# Patient Record
Sex: Female | Born: 1970 | Race: White | Hispanic: No | Marital: Married | Smoking: Current every day smoker
Health system: Southern US, Community
[De-identification: ages and names within clinical notes are randomized; demographics above are authoritative.]

## PROBLEM LIST (undated history)

## (undated) DIAGNOSIS — F329 Major depressive disorder, single episode, unspecified: Secondary | ICD-10-CM

## (undated) DIAGNOSIS — F32A Depression, unspecified: Secondary | ICD-10-CM

## (undated) DIAGNOSIS — T7840XA Allergy, unspecified, initial encounter: Secondary | ICD-10-CM

## (undated) DIAGNOSIS — F419 Anxiety disorder, unspecified: Secondary | ICD-10-CM

## (undated) HISTORY — DX: Allergy, unspecified, initial encounter: T78.40XA

## (undated) HISTORY — PX: HAND SURGERY: SHX662

## (undated) HISTORY — DX: Anxiety disorder, unspecified: F41.9

## (undated) HISTORY — PX: TUBAL LIGATION: SHX77

## (undated) HISTORY — DX: Major depressive disorder, single episode, unspecified: F32.9

## (undated) HISTORY — DX: Depression, unspecified: F32.A

## (undated) HISTORY — PX: WISDOM TOOTH EXTRACTION: SHX21

---

## 1998-07-22 ENCOUNTER — Other Ambulatory Visit: Admission: RE | Admit: 1998-07-22 | Discharge: 1998-07-22 | Payer: Self-pay | Admitting: *Deleted

## 2002-11-25 ENCOUNTER — Emergency Department (HOSPITAL_COMMUNITY): Admission: EM | Admit: 2002-11-25 | Discharge: 2002-11-26 | Payer: Self-pay | Admitting: *Deleted

## 2002-11-26 ENCOUNTER — Ambulatory Visit (HOSPITAL_BASED_OUTPATIENT_CLINIC_OR_DEPARTMENT_OTHER): Admission: RE | Admit: 2002-11-26 | Discharge: 2002-11-26 | Payer: Self-pay | Admitting: Orthopedic Surgery

## 2002-11-26 ENCOUNTER — Encounter: Payer: Self-pay | Admitting: *Deleted

## 2009-04-10 ENCOUNTER — Encounter: Admission: RE | Admit: 2009-04-10 | Discharge: 2009-04-10 | Payer: Self-pay | Admitting: Family Medicine

## 2011-02-05 NOTE — Op Note (Signed)
[name], [name]                       ACCOUNT NO.:  [id number]   MEDICAL RECORD NO.:  [id number]                   PATIENT TYPE:  AMB   LOCATION:  [facility]SC                                  FACILITY:  [facility]   PHYSICIAN:  Katy Fitch. Naaman Plummer., M.D.          DATE OF BIRTH:  [birth year]   DATE OF PROCEDURE:  11/26/2002  DATE OF DISCHARGE:                                 OPERATIVE REPORT   PREOPERATIVE DIAGNOSIS:  Status post bilateral dog bite injuries with  development of severe cellulitis and early abscess formation, left hand and  ring finger.   POSTOPERATIVE DIAGNOSIS:  Status post bilateral dog bite injuries with  development of severe cellulitis and early abscess formation, left hand and  ring finger.   OPERATION:  Incision and drainage of multiple small dog bite abscesses, left  hand including dorsal wound and palmar aspect of left ring finger proximal  phalangeal segment with exploration and irrigation of flexor sheath.   SURGEON:  Katy Fitch. Sypher, M.D.   ASSISTANT:  None.   ANESTHESIA:  Infraclavicular regional block by Dr. Janetta Hora. [place].   INDICATIONS:  The patient is a 40 year old woman with a history of breaking  up a dog fight on November 25, 2002 sustaining deep wounds to be both hands.  She was seen by Dr. Sheppard Penton. Mayer in the emergency room last evening and  had her wounds irrigated, debrided, and tacked with Iodoform gauze.  She was  advised to seek follow-up in 24 hours at our office.  Upon examination, she  is noted to be developing an abscess on the palmar aspect of her left ring  finger.  We made arrangements for immediate incision and drainage at this  time.   DESCRIPTION OF PROCEDURE:  The patient is brought to the operating room and  placed in the supine position on the operating table.  Following placement  of axillary block, the left arm was prepped with Betadine soap and solution  and sterilely draped.   Following exsanguination of the limb  with Esmarch bandage, an arterial  tourniquet was inflated to 240 mmHg.  The procedure commenced with  meticulous examination of the hand.  There appeared to be swelling of flexor  sheath of the ring finger.  The sheath was exposed at the distal palmar  crease through an oblique incision exposing the A1 pulley.  The sheath was  entered and found to have some fluid collecting.   A second abscess was opened with a Brunner's zigzag incision distally  exposing the subcutaneous space.  Purulent material is recovered from the  subcutaneous space and cultured for aerobic and anaerobic growth.   The patient has been on oral Keflex for at least 16 hours therefore the  culture may be impaired by the oral antibiotics.   The wound was irrigated thoroughly with sterile saline, followed by  threading of the #5 pediatric feeding tube into the flexor sheath.  The  sheath was irrigated until the fluid was clear.  The sheath was sewn in  place with a 5-0 nylon suture at the proximal distal palmar crease wound.   The dorsal hand wound was then widely opened, irrigated with sterile saline,  and dressed with a vesi-loop drain.  The wounds were then dressed with  nonadherent dressings, followed by a voluminous Kerlix gauze dressing with  ACE wrap.  The patient is advised to return to our office for follow up  including a whirlpool therapy in 24 hours.   DISCHARGE MEDICATIONS:  Tylox 5 mg 1-2 tablets p.o. q.4-6h. p.r.n. pain (a  total of 30 tablets).   DISCHARGE INSTRUCTIONS:  She will return to see [place] in the office in 24 hours  or sooner p.r.n. problems.                                                 Katy Fitch Naaman Plummer., M.D.    RVS/MEDQ  D:  11/26/2002  T:  11/26/2002  Job:  [id number]

## 2011-04-02 ENCOUNTER — Emergency Department (HOSPITAL_COMMUNITY): Payer: BC Managed Care – PPO

## 2011-04-02 ENCOUNTER — Encounter (HOSPITAL_COMMUNITY): Payer: Self-pay

## 2011-04-02 ENCOUNTER — Emergency Department (HOSPITAL_COMMUNITY)
Admission: EM | Admit: 2011-04-02 | Discharge: 2011-04-02 | Disposition: A | Discharge status: Home / Self Care | Payer: BC Managed Care – PPO | Attending: Emergency Medicine | Admitting: Emergency Medicine

## 2011-04-02 DIAGNOSIS — F411 Generalized anxiety disorder: Secondary | ICD-10-CM | POA: Insufficient documentation

## 2011-04-02 DIAGNOSIS — R0602 Shortness of breath: Secondary | ICD-10-CM | POA: Insufficient documentation

## 2011-04-02 DIAGNOSIS — R0789 Other chest pain: Secondary | ICD-10-CM | POA: Insufficient documentation

## 2011-04-02 DIAGNOSIS — R0601 Orthopnea: Secondary | ICD-10-CM | POA: Insufficient documentation

## 2013-01-05 ENCOUNTER — Other Ambulatory Visit: Payer: Self-pay

## 2013-01-05 ENCOUNTER — Ambulatory Visit (INDEPENDENT_AMBULATORY_CARE_PROVIDER_SITE_OTHER): Payer: BC Managed Care – PPO | Admitting: Family Medicine

## 2013-01-05 VITALS — BP 142/88 | HR 86 | Temp 100.0°F | Resp 20 | Ht 68.0 in | Wt 304.0 lb

## 2013-01-05 DIAGNOSIS — F418 Other specified anxiety disorders: Secondary | ICD-10-CM

## 2013-01-05 DIAGNOSIS — F341 Dysthymic disorder: Secondary | ICD-10-CM

## 2013-01-05 LAB — TSH: TSH: 2.249 u[IU]/mL (ref 0.350–4.500)

## 2013-01-05 MED ORDER — CITALOPRAM HYDROBROMIDE 20 MG PO TABS: 20.0000 mg | ORAL_TABLET | Freq: Every day | ORAL | Status: DC

## 2013-01-05 NOTE — Progress Notes (Signed)
  Subjective:    Patient ID: [name], female    DOB: 05/06/1971, 42 y.o.   MRN: [id number]  HPI [name] is a 42 y.o. female Here for follow up of anxiety.  Prior patient of [facility] - started on Celexa 20mg  QD about 2 years ago. Last dose today. Has skipped few days at times if running low so will not run out. Has missed few doses at times. Treated for anxiety, but also has had depression. Initially had anxiety attack.  Had been on Cymbalta in past, with 2 prior attempts at coming off medicine. - sx's recur few years later.  Has not seen counselor prior. No suicide sx's.  Stress with work prior. Feels down all the time, trouble with motivation, tearful episodes at times. occassional anhedonia.   No fever, has had some allergy symptoms.   Not working - out of work few years. smokes 1 ppd, no etoh, no illicit drug use.     Review of Systems  Constitutional: Negative for fever.  HENT: Positive for rhinorrhea.   Respiratory: Negative for cough.   Gastrointestinal: Negative for abdominal pain.  Psychiatric/Behavioral: Positive for sleep disturbance and dysphoric mood. Negative for hallucinations. The patient is nervous/anxious.        Objective:   Physical Exam  Vitals reviewed. Constitutional: She is oriented to person, place, and time. She appears well-developed and well-nourished. No distress.  overweight  HENT:  Head: Normocephalic and atraumatic.  Neck: Normal range of motion. Neck supple. Thyromegaly present.  Cardiovascular: Normal rate, normal heart sounds and intact distal pulses.   Pulmonary/Chest: Effort normal and breath sounds normal.  Abdominal: Soft. There is no tenderness.  Neurological: She is alert and oriented to person, place, and time.  Skin: Skin is warm and dry.  Psychiatric: She has a normal mood and affect. Her behavior is normal.        Assessment & Plan:  [name] is a 42 y.o. female Depression with anxiety -  Plan: TSH, citalopram (CELEXA) 20 MG tablet recurrent depression with anxiety.  New pt. Continue celexa, refer for counselling, discussed exercise, check tsh and recheck ov next 1 month. rtc precautions.  Meds ordered this encounter  Medications  . DISCONTD: citalopram (CELEXA) 20 MG tablet    Sig: Take 20 mg by mouth daily.  Marland Kitchen loratadine (CLARITIN) 10 MG tablet    Sig: Take 10 mg by mouth daily.  . citalopram (CELEXA) 20 MG tablet    Sig: Take 1 tablet (20 mg total) by mouth daily.    Dispense:  90 tablet    Refill:  0   Patient Instructions  Continue the celexa - 1 per day.  Call one of the counselors below to arrange an appointment. walking or other activity most days of the week. claritin for allergies. Your should receive a call or letter about your lab results within the next week to 10 days. Recheck in next 4-6 weeks.  Return to the clinic or go to the nearest emergency room if any of your symptoms worsen or new symptoms occur, including any fever.   [facility] [place]: [phone] [place] or [place]: [phone] [place]: [phone]

## 2013-01-05 NOTE — Patient Instructions (Addendum)
Continue the celexa - 1 per day.  Call one of the counselors below to arrange an appointment. walking or other activity most days of the week. claritin for allergies. Your should receive a call or letter about your lab results within the next week to 10 days. Recheck in next 4-6 weeks.  Return to the clinic or go to the nearest emergency room if any of your symptoms worsen or new symptoms occur, including any fever.   [facility] [place]: [phone] [place] or [place]: [phone] [place]: [phone]

## 2013-08-17 ENCOUNTER — Other Ambulatory Visit: Payer: Self-pay | Admitting: Family Medicine

## 2013-08-20 ENCOUNTER — Other Ambulatory Visit: Payer: Self-pay | Admitting: Family Medicine

## 2013-08-20 NOTE — Telephone Encounter (Signed)
Called in Celexa for 30 day, can not authorize 90 day without visit.

## 2013-08-21 NOTE — Telephone Encounter (Signed)
Patient needs a follow up for further refills

## 2013-08-21 NOTE — Telephone Encounter (Signed)
Left message for pt to rtc for follow up. Per last OV in April pt was to follow up 4-6 weeks following visit. Ok to fill 30 day supply with note?

## 2013-08-21 NOTE — Telephone Encounter (Signed)
Noted.  Yes - 30 day supply ok  - needs follow up before this running out to lessen chance of w/d sx's.

## 2013-08-23 NOTE — Telephone Encounter (Addendum)
This is coming to me as addendum. 30 day supply was already sent on 11/28

## 2014-06-04 ENCOUNTER — Ambulatory Visit (INDEPENDENT_AMBULATORY_CARE_PROVIDER_SITE_OTHER): Payer: Self-pay | Admitting: Family Medicine

## 2014-06-04 VITALS — BP 126/86 | HR 85 | Temp 98.3°F | Resp 16 | Ht 68.0 in | Wt 296.0 lb

## 2014-06-04 DIAGNOSIS — J0100 Acute maxillary sinusitis, unspecified: Secondary | ICD-10-CM

## 2014-06-04 DIAGNOSIS — F410 Panic disorder [episodic paroxysmal anxiety] without agoraphobia: Secondary | ICD-10-CM

## 2014-06-04 DIAGNOSIS — J0101 Acute recurrent maxillary sinusitis: Secondary | ICD-10-CM

## 2014-06-04 MED ORDER — LORATADINE 10 MG PO TABS: 10.0000 mg | ORAL_TABLET | Freq: Every day | ORAL | Status: DC

## 2014-06-04 MED ORDER — PREDNISONE 20 MG PO TABS: ORAL_TABLET | ORAL | Status: DC

## 2014-06-04 MED ORDER — AMOXICILLIN 875 MG PO TABS: 875.0000 mg | ORAL_TABLET | Freq: Three times a day (TID) | ORAL | Status: DC

## 2014-06-04 MED ORDER — CITALOPRAM HYDROBROMIDE 20 MG PO TABS: 20.0000 mg | ORAL_TABLET | Freq: Every day | ORAL | Status: DC

## 2014-06-04 NOTE — Progress Notes (Signed)
Subjective:    Patient ID: [name], female    DOB: [birth year], 43 y.o.   MRN: [id number] Chief Complaint  Patient presents with  . rx refills    celexa  . Nasal Congestion    x1 week   . Fever    night and morning     HPI  Has been off of the celexa for several months as she has been out of insurance.  When she was on one  celexa a day and doing great but then when she lost her insurance she tried to stock pile them for when she needs it.  Developed nasal congestion, sneezing several days ago with diarrhea.  Has had fevers and chills started yesterday.    Past Medical History  Diagnosis Date  . Allergy   . Depression   . Anxiety    No current outpatient prescriptions on file prior to visit.   No current facility-administered medications on file prior to visit.   No Known Allergies   Review of Systems  Constitutional: Positive for fever, chills, diaphoresis, activity change, appetite change and fatigue. Negative for unexpected weight change.  HENT: Positive for congestion, ear pain, postnasal drip, rhinorrhea, sinus pressure, sneezing and sore throat. Negative for ear discharge, nosebleeds, trouble swallowing and voice change.   Eyes: Negative for discharge and itching.  Respiratory: Positive for cough. Negative for shortness of breath.   Cardiovascular: Negative for chest pain.  Gastrointestinal: Positive for diarrhea. Negative for nausea, vomiting and abdominal pain.  Musculoskeletal: Negative for neck pain and neck stiffness.  Skin: Negative for rash.  Neurological: Positive for headaches. Negative for dizziness and syncope.  Hematological: Positive for adenopathy.  Psychiatric/Behavioral: Positive for behavioral problems, sleep disturbance and dysphoric mood. Negative for suicidal ideas and self-injury. The patient is nervous/anxious.        Objective:  BP 126/86  Pulse 85  Temp(Src) 98.3 F (36.8 C) (Oral)  Resp 16  Ht  (1.727 m)  Wt 296 lb  (134.265 kg)  BMI 45.02 kg/m2  SpO2 96%  LMP 05/16/2014  Physical Exam  Constitutional: She is oriented to person, place, and time. She appears well-developed and well-nourished. She appears lethargic. She appears ill. No distress.  HENT:  Head: Normocephalic and atraumatic.  Right Ear: External ear and ear canal normal. Tympanic membrane is retracted. A middle ear effusion is present.  Left Ear: External ear and ear canal normal. Tympanic membrane is retracted. A middle ear effusion is present.  Nose: Mucosal edema and rhinorrhea present. Right sinus exhibits maxillary sinus tenderness. Left sinus exhibits maxillary sinus tenderness.  Mouth/Throat: Uvula is midline and mucous membranes are normal. Posterior oropharyngeal erythema present. No oropharyngeal exudate, posterior oropharyngeal edema or tonsillar abscesses.  Eyes: Conjunctivae are normal. Right eye exhibits no discharge. Left eye exhibits no discharge. No scleral icterus.  Neck: Normal range of motion. Neck supple.  Cardiovascular: Normal rate, regular rhythm, normal heart sounds and intact distal pulses.   Pulmonary/Chest: Effort normal and breath sounds normal.  Lymphadenopathy:       Head (right side): Submandibular adenopathy present. No preauricular and no posterior auricular adenopathy present.       Head (left side): Submandibular adenopathy present. No preauricular and no posterior auricular adenopathy present.    She has no cervical adenopathy.       Right: No supraclavicular adenopathy present.       Left: No supraclavicular adenopathy present.  Neurological: She is oriented to person, place, and time.  She appears lethargic.  Skin: Skin is warm and dry. She is not diaphoretic. No erythema.  Psychiatric: She has a normal mood and affect. Her behavior is normal.          Assessment & Plan:   Panic attacks - was previously well controlled on celexa 20 but off for sev mos due to cost of RTC - restart, taper up with  1/2 tab po qd x 1-2 wks and increase to 1 tab po qd after 1-2 wks as long as side effect free.  Acute recurrent maxillary sinusitis  Meds ordered this encounter  Medications  . citalopram (CELEXA) 20 MG tablet    Sig: Take 1 tablet (20 mg total) by mouth daily.    Dispense:  90 tablet    Refill:  3  . loratadine (CLARITIN) 10 MG tablet    Sig: Take 1 tablet (10 mg total) by mouth daily.    Dispense:  90 tablet    Refill:  3  . amoxicillin (AMOXIL) 875 MG tablet    Sig: Take 1 tablet (875 mg total) by mouth 3 (three) times daily.    Dispense:  30 tablet    Refill:  0  . predniSONE (DELTASONE) 20 MG tablet    Sig: Take 3 tabs po qd x 3d, take 2 tabs po qd x 3d, take 1 tab po qd x 3d    Dispense:  18 tablet    Refill:  0   Norberto Sorenson, MD MPH

## 2014-06-04 NOTE — Patient Instructions (Addendum)
Start taking 1/2 tab daily of the citalopram then you can go up to 1 whole tab a day after a week as long as you are not having any side effects.  Hot showers or breathing in steam may help loosen the congestion.  Using a netti pot or sinus rinse is also likely to help you feel better and keep this from progressing. I recommend augmenting with 12 hr sudafed (behind the counter) and generic mucinex to help you move out the congestion.  If no improvement or you are getting worse, come back but hopefully with all of the above, you can avoid it.  Sinusitis Sinusitis is redness, soreness, and inflammation of the paranasal sinuses. Paranasal sinuses are air pockets within the bones of your face (beneath the eyes, the middle of the forehead, or above the eyes). In healthy paranasal sinuses, mucus is able to drain out, and air is able to circulate through them by way of your nose. However, when your paranasal sinuses are inflamed, mucus and air can become trapped. This can allow bacteria and other germs to grow and cause infection. Sinusitis can develop quickly and last only a short time (acute) or continue over a long period (chronic). Sinusitis that lasts for more than 12 weeks is considered chronic.  CAUSES  Causes of sinusitis include:  Allergies.  Structural abnormalities, such as displacement of the cartilage that separates your nostrils (deviated septum), which can decrease the air flow through your nose and sinuses and affect sinus drainage.  Functional abnormalities, such as when the small hairs (cilia) that line your sinuses and help remove mucus do not work properly or are not present. SIGNS AND SYMPTOMS  Symptoms of acute and chronic sinusitis are the same. The primary symptoms are pain and pressure around the affected sinuses. Other symptoms include:  Upper toothache.  Earache.  Headache.  Bad breath.  Decreased sense of smell and taste.  A cough, which worsens when you are lying  flat.  Fatigue.  Fever.  Thick drainage from your nose, which often is green and may contain pus (purulent).  Swelling and warmth over the affected sinuses. DIAGNOSIS  Your health care provider will perform a physical exam. During the exam, your health care provider may:  Look in your nose for signs of abnormal growths in your nostrils (nasal polyps).  Tap over the affected sinus to check for signs of infection.  View the inside of your sinuses (endoscopy) using an imaging device that has a light attached (endoscope). If your health care provider suspects that you have chronic sinusitis, one or more of the following tests may be recommended:  Allergy tests.  Nasal culture. A sample of mucus is taken from your nose, sent to a lab, and screened for bacteria.  Nasal cytology. A sample of mucus is taken from your nose and examined by your health care provider to determine if your sinusitis is related to an allergy. TREATMENT  Most cases of acute sinusitis are related to a viral infection and will resolve on their own within 10 days. Sometimes medicines are prescribed to help relieve symptoms (pain medicine, decongestants, nasal steroid sprays, or saline sprays).  However, for sinusitis related to a bacterial infection, your health care provider will prescribe antibiotic medicines. These are medicines that will help kill the bacteria causing the infection.  Rarely, sinusitis is caused by a fungal infection. In theses cases, your health care provider will prescribe antifungal medicine. For some cases of chronic sinusitis, surgery is needed.  Generally, these are cases in which sinusitis recurs more than 3 times per year, despite other treatments. HOME CARE INSTRUCTIONS   Drink plenty of water. Water helps thin the mucus so your sinuses can drain more easily.  Use a humidifier.  Inhale steam 3 to 4 times a day (for example, sit in the bathroom with the shower running).  Apply a warm,  moist washcloth to your face 3 to 4 times a day, or as directed by your health care provider.  Use saline nasal sprays to help moisten and clean your sinuses.  Take medicines only as directed by your health care provider.  If you were prescribed either an antibiotic or antifungal medicine, finish it all even if you start to feel better. SEEK IMMEDIATE MEDICAL CARE IF:  You have increasing pain or severe headaches.  You have nausea, vomiting, or drowsiness.  You have swelling around your face.  You have vision problems.  You have a stiff neck.  You have difficulty breathing. MAKE SURE YOU:   Understand these instructions.  Will watch your condition.  Will get help right away if you are not doing well or get worse. Document Released: 09/06/2005 Document Revised: 01/21/2014 Document Reviewed: 09/21/2011 [facility] Patient Information 2015 [place], [place]. This information is not intended to replace advice given to you by your health care provider. Make sure you discuss any questions you have with your health care provider.

## 2014-11-30 ENCOUNTER — Encounter (HOSPITAL_COMMUNITY): Payer: Self-pay | Admitting: Emergency Medicine

## 2014-11-30 ENCOUNTER — Emergency Department (HOSPITAL_COMMUNITY)
Admission: EM | Admit: 2014-11-30 | Discharge: 2014-12-01 | Disposition: A | Discharge status: Home / Self Care | Payer: Self-pay | Attending: Emergency Medicine | Admitting: Emergency Medicine

## 2014-11-30 DIAGNOSIS — F419 Anxiety disorder, unspecified: Secondary | ICD-10-CM | POA: Insufficient documentation

## 2014-11-30 DIAGNOSIS — R109 Unspecified abdominal pain: Secondary | ICD-10-CM

## 2014-11-30 DIAGNOSIS — F329 Major depressive disorder, single episode, unspecified: Secondary | ICD-10-CM | POA: Insufficient documentation

## 2014-11-30 DIAGNOSIS — Z9851 Tubal ligation status: Secondary | ICD-10-CM | POA: Insufficient documentation

## 2014-11-30 DIAGNOSIS — Z3202 Encounter for pregnancy test, result negative: Secondary | ICD-10-CM | POA: Insufficient documentation

## 2014-11-30 DIAGNOSIS — N8320 Unspecified ovarian cysts: Secondary | ICD-10-CM | POA: Insufficient documentation

## 2014-11-30 DIAGNOSIS — Z720 Tobacco use: Secondary | ICD-10-CM | POA: Insufficient documentation

## 2014-11-30 DIAGNOSIS — N83201 Unspecified ovarian cyst, right side: Secondary | ICD-10-CM

## 2014-11-30 DIAGNOSIS — Z79899 Other long term (current) drug therapy: Secondary | ICD-10-CM | POA: Insufficient documentation

## 2014-11-30 DIAGNOSIS — R110 Nausea: Secondary | ICD-10-CM | POA: Insufficient documentation

## 2014-11-30 LAB — POC URINE PREG, ED: Preg Test, Ur: NEGATIVE
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
## 2014-11-30 NOTE — ED Notes (Signed)
Pt reports right groin (radiating to right side) pain starting a month ago. Has intermittent nausea. Denies F/V/D/chills. No hx kidney stones but does have hx ovarian cysts. No pain medication taken today. Denies dysuria and any other urinary symptoms. No other c/c.

## 2014-12-01 ENCOUNTER — Emergency Department (HOSPITAL_COMMUNITY): Payer: Self-pay

## 2014-12-01 LAB — CBC WITH DIFFERENTIAL/PLATELET
Basophils Absolute: 0 10*3/uL (ref 0.0–0.1)
Basophils Relative: 0 % (ref 0–1)
EOS ABS: 0.2 10*3/uL (ref 0.0–0.7)
Eosinophils Relative: 2 % (ref 0–5)
HCT: 41.2 % (ref 36.0–46.0)
HEMOGLOBIN: 13.4 g/dL (ref 12.0–15.0)
LYMPHS ABS: 5.7 10*3/uL — AB (ref 0.7–4.0)
LYMPHS PCT: 41 % (ref 12–46)
MCH: 27.6 pg (ref 26.0–34.0)
MCHC: 32.5 g/dL (ref 30.0–36.0)
MCV: 84.8 fL (ref 78.0–100.0)
MONO ABS: 0.7 10*3/uL (ref 0.1–1.0)
MONOS PCT: 5 % (ref 3–12)
NEUTROS ABS: 7.2 10*3/uL (ref 1.7–7.7)
Neutrophils Relative %: 52 % (ref 43–77)
PLATELETS: 203 10*3/uL (ref 150–400)
RBC: 4.86 MIL/uL (ref 3.87–5.11)
RDW: 13.4 % (ref 11.5–15.5)
WBC: 13.7 10*3/uL — AB (ref 4.0–10.5)

## 2014-12-01 LAB — COMPREHENSIVE METABOLIC PANEL
ALK PHOS: 74 U/L (ref 39–117)
ALT: 12 U/L (ref 0–35)
ANION GAP: 8 (ref 5–15)
AST: 17 U/L (ref 0–37)
Albumin: 3.5 g/dL (ref 3.5–5.2)
BUN: 12 mg/dL (ref 6–23)
CO2: 24 mmol/L (ref 19–32)
Calcium: 9 mg/dL (ref 8.4–10.5)
Chloride: 107 mmol/L (ref 96–112)
Creatinine, Ser: 0.86 mg/dL (ref 0.50–1.10)
GFR calc Af Amer: >90 mL/min (ref >90)
GFR calc non Af Amer: 83 mL/min — ABNORMAL LOW (ref >90)
GLUCOSE: 97 mg/dL (ref 70–99)
POTASSIUM: 3.9 mmol/L (ref 3.5–5.1)
Sodium: 139 mmol/L (ref 135–145)
TOTAL PROTEIN: 6.9 g/dL (ref 6.0–8.3)
Total Bilirubin: 0.5 mg/dL (ref 0.3–1.2)

## 2014-12-01 LAB — LIPASE, BLOOD: Lipase: 17 U/L (ref 11–59)

## 2014-12-01 MED ORDER — ONDANSETRON 4 MG PO TBDP
4.0000 mg | ORAL_TABLET | Freq: Once | ORAL | Status: AC
  Administered 2014-12-01: 4 mg via ORAL
  Filled 2014-12-01: qty 1

## 2014-12-01 MED ORDER — MORPHINE SULFATE 4 MG/ML IJ SOLN
4.0000 mg | Freq: Once | INTRAMUSCULAR | Status: AC
  Administered 2014-12-01: 4 mg via INTRAVENOUS
  Filled 2014-12-01: qty 1

## 2014-12-01 MED ORDER — DICYCLOMINE HCL 10 MG PO CAPS
10.0000 mg | ORAL_CAPSULE | Freq: Once | ORAL | Status: AC
  Administered 2014-12-01: 10 mg via ORAL
  Filled 2014-12-01: qty 1

## 2014-12-01 MED ORDER — IOHEXOL 300 MG/ML  SOLN
50.0000 mL | Freq: Once | INTRAMUSCULAR | Status: AC | PRN
  Administered 2014-12-01: 50 mL via ORAL

## 2014-12-01 MED ORDER — IOHEXOL 300 MG/ML  SOLN
100.0000 mL | Freq: Once | INTRAMUSCULAR | Status: AC | PRN
  Administered 2014-12-01: 100 mL via INTRAVENOUS

## 2014-12-01 MED ORDER — NAPROXEN 500 MG PO TABS: 500.0000 mg | ORAL_TABLET | Freq: Two times a day (BID) | ORAL | Status: DC

## 2014-12-01 NOTE — Discharge Instructions (Signed)
Please follow the directions provided.  [facility].  Take the naproxen twice a day.  Don't hesitate to return for any new, worsening or concerning symptoms.     SEEK IMMEDIATE MEDICAL CARE IF:  You have increasing abdominal pain.  You feel sick to your stomach (nauseous), and you throw up (vomit).  You develop a fever that comes on suddenly.  You have abdominal pain during a bowel movement.  Your menstrual periods become heavier than usual.

## 2014-12-01 NOTE — ED Provider Notes (Signed)
1:30 AM: At end of shift, received hand-off report from Will Dansie, PA-C. Plan includes awaiting Ct abd/pelvis result to eval rt lower quadrant pain. Pt currently resting without distress.   3:00 AM: Pt continues to rest without distress, awaiting CT results.  05:00 AM: CT results reviewed: shows right ovarian cyst.  Discussed findings with pt.  Pt is well-appearing, in no acute distress and vital signs reviewed and not concerning. Although not charted in nursing note, the vital signs I observed in the room prior to discharge were bp: 132/76, hr 78. She appears safe to be discharged.  Discharge include resources [facility]. Advised to NSAIDs for pain.  Return precautions provided. Pt aware of plan and in agreement.   Filed Vitals:   11/30/14 2231 12/01/14 0144  BP: 147/92 111/57  Pulse: 85 71  Temp: 98.3 F (36.8 C) 98.6 F (37 C)  TempSrc: Oral Oral  Resp: 16 16  SpO2: 98% 97%     Meds given in ED:  Medications  ondansetron (ZOFRAN-ODT) disintegrating tablet 4 mg (4 mg Oral Given 12/01/14 0055)  dicyclomine (BENTYL) capsule 10 mg (10 mg Oral Given 12/01/14 0055)  iohexol (OMNIPAQUE) 300 MG/ML solution 50 mL (50 mLs Oral Contrast Given 12/01/14 0316)  iohexol (OMNIPAQUE) 300 MG/ML solution 100 mL (100 mLs Intravenous Contrast Given 12/01/14 0407)  morphine 4 MG/ML injection 4 mg (4 mg Intravenous Given 12/01/14 0550)    Discharge Medication List as of 12/01/2014  5:42 AM    START taking these medications   Details  naproxen (NAPROSYN) 500 MG tablet Take 1 tablet (500 mg total) by mouth 2 (two) times daily., Starting 12/01/2014, Until Discontinued, Print                  Harle BattiestElizabeth Virgin Zellers, NP 12/01/14 1617  Devoria AlbeIva Knapp, MD 12/02/14 [phone]

## 2014-12-12 ENCOUNTER — Inpatient Hospital Stay (HOSPITAL_COMMUNITY)
Admission: AD | Admit: 2014-12-12 | Discharge: 2014-12-13 | Disposition: A | Discharge status: Home / Self Care | Payer: Self-pay | Source: Ambulatory Visit | Attending: Obstetrics and Gynecology | Admitting: Obstetrics and Gynecology

## 2014-12-12 DIAGNOSIS — Z8742 Personal history of other diseases of the female genital tract: Secondary | ICD-10-CM

## 2014-12-12 DIAGNOSIS — Z79899 Other long term (current) drug therapy: Secondary | ICD-10-CM | POA: Insufficient documentation

## 2014-12-12 DIAGNOSIS — R1032 Left lower quadrant pain: Secondary | ICD-10-CM | POA: Insufficient documentation

## 2014-12-12 DIAGNOSIS — R1031 Right lower quadrant pain: Secondary | ICD-10-CM | POA: Insufficient documentation

## 2014-12-12 DIAGNOSIS — F419 Anxiety disorder, unspecified: Secondary | ICD-10-CM | POA: Insufficient documentation

## 2014-12-12 DIAGNOSIS — F17210 Nicotine dependence, cigarettes, uncomplicated: Secondary | ICD-10-CM | POA: Insufficient documentation

## 2014-12-12 DIAGNOSIS — R109 Unspecified abdominal pain: Secondary | ICD-10-CM

## 2014-12-12 DIAGNOSIS — Z833 Family history of diabetes mellitus: Secondary | ICD-10-CM | POA: Insufficient documentation

## 2014-12-12 DIAGNOSIS — N200 Calculus of kidney: Secondary | ICD-10-CM | POA: Insufficient documentation

## 2014-12-12 DIAGNOSIS — F329 Major depressive disorder, single episode, unspecified: Secondary | ICD-10-CM | POA: Insufficient documentation

## 2014-12-13 ENCOUNTER — Encounter (HOSPITAL_COMMUNITY): Payer: Self-pay

## 2014-12-13 ENCOUNTER — Inpatient Hospital Stay (HOSPITAL_COMMUNITY): Payer: Self-pay

## 2014-12-13 DIAGNOSIS — Z8742 Personal history of other diseases of the female genital tract: Secondary | ICD-10-CM

## 2014-12-13 LAB — URINALYSIS, DIPSTICK ONLY
Bilirubin Urine: NEGATIVE
GLUCOSE, UA: NEGATIVE mg/dL
Ketones, ur: NEGATIVE mg/dL
Nitrite: NEGATIVE
PH: 6 (ref 5.0–8.0)
Protein, ur: NEGATIVE mg/dL
SPECIFIC GRAVITY, URINE: 1.025 (ref 1.005–1.030)
Urobilinogen, UA: 0.2 mg/dL (ref 0.0–1.0)

## 2014-12-13 LAB — POCT PREGNANCY, URINE: Preg Test, Ur: NEGATIVE

## 2014-12-13 MED ORDER — OXYCODONE-ACETAMINOPHEN 5-325 MG PO TABS
1.0000 | ORAL_TABLET | ORAL | Status: DC | PRN
Start: 2014-12-13 — End: 2017-02-11

## 2014-12-13 NOTE — MAU Note (Signed)
RLQ pain x 6-7 weeks. Was told at ED 2 weeks ago that she has ovarian cyst. States now the pain is moving up her sides and feels like "back labor". Denies dysuria. States that this doesn't feel like ovarian cysts she's had in the past. Has had low grade fever off and on, last one was 100 a few days ago. Saw some blood last time she went to the bathroom, thinks it's coming from her vagina; period is due any day now. Denies vaginal discharge.

## 2014-12-13 NOTE — MAU Provider Note (Signed)
History     CSN: [id number]  Arrival date and time: 12/12/14 2314   First Provider Initiated Contact with Patient 12/13/14 0029      Chief Complaint  Patient presents with  . Flank Pain  . Abdominal Pain   HPI Comments: [name] is a 44 y.o. [id number] who presents today with RLQ and right flank pain. She states that it feels like "back  Labor", and rates her pain 8/10 at this time. She states that she has had the pain for about a month, and was seen at the [facility] ED, and was told she had an ovarian cyst. She had a CT scan done at that time. Record review shows that the CT scan also showed non-obstructing stones in both kidneys. She states she took ibuprofen and naprosyn yesterday, and it did not help with the pain.   Flank Pain The current episode started more than 1 month ago. The problem occurs constantly. The problem has been gradually worsening since onset. The pain is at a severity of 8/10. Associated symptoms include abdominal pain. Pertinent negatives include no dysuria or fever. She has tried NSAIDs for the symptoms. The treatment provided no relief.  Abdominal Pain The current episode started more than 1 month ago. The pain is located in the left flank, right flank, LLQ and RLQ. The pain is at a severity of 8/10. The quality of the pain is colicky. Associated symptoms include constipation (had to push for a BM today ) and nausea. Pertinent negatives include no diarrhea, dysuria, fever, frequency or vomiting.      Past Medical History  Diagnosis Date  . Allergy   . Depression   . Anxiety     Past Surgical History  Procedure Laterality Date  . Tubal ligation    . Wisdom tooth extraction      Family History  Problem Relation Age of Onset  . Diabetes Mother   . Asthma Mother   . Obesity Mother   . Diabetes Paternal Grandmother     History  Substance Use Topics  . Smoking status: Current Every Day Smoker -- 0.25 packs/day    Types: Cigarettes  . Smokeless  tobacco: Not on file  . Alcohol Use: No    Allergies: No Known Allergies  Prescriptions prior to admission  Medication Sig Dispense Refill Last Dose  . citalopram (CELEXA) 20 MG tablet Take 1 tablet (20 mg total) by mouth daily. 90 tablet 3 12/12/2014 at Unknown time  . ibuprofen (ADVIL,MOTRIN) 200 MG tablet Take 800 mg by mouth every 6 (six) hours as needed for moderate pain.   12/12/2014 at Unknown time    Review of Systems  Constitutional: Negative for fever.  Gastrointestinal: Positive for nausea, abdominal pain and constipation (had to push for a BM today ). Negative for vomiting and diarrhea.  Genitourinary: Positive for flank pain. Negative for dysuria, urgency and frequency.   Physical Exam   Blood pressure 144/81, pulse 70, temperature 99 F (37.2 C), temperature source Oral, resp. rate 18, height  (1.727 m), weight 127.007 kg (280 lb), last menstrual period 11/16/2014.  Physical Exam  Nursing note and vitals reviewed. Constitutional: She is oriented to person, place, and time. She appears well-developed and well-nourished. No distress.  Cardiovascular: Normal rate.   Respiratory: Effort normal.  GI: Soft. There is no tenderness.  Neurological: She is alert and oriented to person, place, and time.  Skin: Skin is warm and dry.  Psychiatric: She has a normal mood  and affect.   Results for orders placed or performed during the hospital encounter of 12/12/14 (from the past 24 hour(s))  Urinalysis, dipstick only     Status: Abnormal   Collection Time: 12/12/14 11:55 PM  Result Value Ref Range   Specific Gravity, Urine 1.025 1.005 - 1.030   pH 6.0 5.0 - 8.0   Glucose, UA NEGATIVE NEGATIVE mg/dL   Hgb urine dipstick TRACE (A) NEGATIVE   Bilirubin Urine NEGATIVE NEGATIVE   Ketones, ur NEGATIVE NEGATIVE mg/dL   Protein, ur NEGATIVE NEGATIVE mg/dL   Urobilinogen, UA 0.2 0.0 - 1.0 mg/dL   Nitrite NEGATIVE NEGATIVE   Leukocytes, UA TRACE (A) NEGATIVE  Pregnancy, urine  POC     Status: None   Collection Time: 12/13/14 12:04 AM  Result Value Ref Range   Preg Test, Ur NEGATIVE NEGATIVE   US Transvaginal Non-ob  12/13/2014   CLINICAL DATA:  Bilateral lower quadrant pain for 7 weeks. Initial encounter.  EXAM: TRANSABDOMINAL AND TRANSVAGINAL ULTRASOUND OF PELVIS  TECHNIQUE: Both transabdominal and transvaginal ultrasound examinations of the pelvis were performed. Transabdominal technique was performed for global imaging of the pelvis including uterus, ovaries, adnexal regions, and pelvic cul-de-sac. It was necessary to proceed with endovaginal exam following the transabdominal exam to visualize the uterus and ovaries in greater detail.  COMPARISON:  CT of the abdomen and pelvis from 12/01/2014  FINDINGS: Uterus  Measurements: 7.8 x 5.2 x 5.2 cm. No fibroids or other mass visualized. Nabothian cysts are seen at the cervix.  Endometrium  Thickness: 1.4 cm.  No focal abnormality visualized.  Right ovary  Measurements: 3.5 x 3.3 x 2.5 cm. Normal appearance/no adnexal mass.  Left ovary  Measurements: 2.6 x 2.6 x 2.5 cm. Normal appearance/no adnexal mass.  Other findings  Trace free fluid is seen within the pelvic cul-de-sac.  IMPRESSION: Unremarkable pelvic ultrasound.   Electronically Signed   By: Roanna Raider M.D.   On: 12/13/2014 01:17   US Pelvis Complete  12/13/2014   CLINICAL DATA:  Bilateral lower quadrant pain for 7 weeks. Initial encounter.  EXAM: TRANSABDOMINAL AND TRANSVAGINAL ULTRASOUND OF PELVIS  TECHNIQUE: Both transabdominal and transvaginal ultrasound examinations of the pelvis were performed. Transabdominal technique was performed for global imaging of the pelvis including uterus, ovaries, adnexal regions, and pelvic cul-de-sac. It was necessary to proceed with endovaginal exam following the transabdominal exam to visualize the uterus and ovaries in greater detail.  COMPARISON:  CT of the abdomen and pelvis from 12/01/2014  FINDINGS: Uterus  Measurements: 7.8 x  5.2 x 5.2 cm. No fibroids or other mass visualized. Nabothian cysts are seen at the cervix.  Endometrium  Thickness: 1.4 cm.  No focal abnormality visualized.  Right ovary  Measurements: 3.5 x 3.3 x 2.5 cm. Normal appearance/no adnexal mass.  Left ovary  Measurements: 2.6 x 2.6 x 2.5 cm. Normal appearance/no adnexal mass.  Other findings  Trace free fluid is seen within the pelvic cul-de-sac.  IMPRESSION: Unremarkable pelvic ultrasound.   Electronically Signed   By: Roanna Raider M.D.   On: 12/13/2014 01:17    MAU Course  Procedures  MDM   Assessment and Plan   1. History of ovarian cyst   2. Right sided abdominal pain   3. Nephrolithiasis    DC home RX percocet FU with urology as needed   Follow-up Information    [facility]   Specialty:  Emergency Medicine   Why:  If symptoms worsen  Contact information:   [address] [id number] [place] [id number] [phone]       Tawnya CrookHogan, Heather Donovan 12/13/2014, 12:31 AM

## 2014-12-13 NOTE — Discharge Instructions (Signed)

## 2016-12-27 ENCOUNTER — Emergency Department (HOSPITAL_COMMUNITY)
Admission: EM | Admit: 2016-12-27 | Discharge: 2016-12-27 | Disposition: A | Discharge status: Home / Self Care | Payer: Self-pay | Attending: Emergency Medicine | Admitting: Emergency Medicine

## 2016-12-27 ENCOUNTER — Encounter (HOSPITAL_COMMUNITY): Payer: Self-pay | Admitting: Emergency Medicine

## 2016-12-27 ENCOUNTER — Emergency Department (HOSPITAL_BASED_OUTPATIENT_CLINIC_OR_DEPARTMENT_OTHER)
Admit: 2016-12-27 | Discharge: 2016-12-27 | Disposition: A | Discharge status: Home / Self Care | Payer: Self-pay | Attending: Emergency Medicine | Admitting: Emergency Medicine

## 2016-12-27 ENCOUNTER — Emergency Department (HOSPITAL_COMMUNITY): Payer: Self-pay

## 2016-12-27 DIAGNOSIS — Z79899 Other long term (current) drug therapy: Secondary | ICD-10-CM | POA: Insufficient documentation

## 2016-12-27 DIAGNOSIS — R072 Precordial pain: Secondary | ICD-10-CM | POA: Insufficient documentation

## 2016-12-27 DIAGNOSIS — Z91040 Latex allergy status: Secondary | ICD-10-CM | POA: Insufficient documentation

## 2016-12-27 DIAGNOSIS — R079 Chest pain, unspecified: Secondary | ICD-10-CM

## 2016-12-27 DIAGNOSIS — M7989 Other specified soft tissue disorders: Secondary | ICD-10-CM

## 2016-12-27 DIAGNOSIS — M79609 Pain in unspecified limb: Secondary | ICD-10-CM

## 2016-12-27 DIAGNOSIS — F17210 Nicotine dependence, cigarettes, uncomplicated: Secondary | ICD-10-CM | POA: Insufficient documentation

## 2016-12-27 DIAGNOSIS — R600 Localized edema: Secondary | ICD-10-CM | POA: Insufficient documentation

## 2016-12-27 LAB — COMPREHENSIVE METABOLIC PANEL
ALT: 16 U/L (ref 14–54)
AST: 16 U/L (ref 15–41)
Albumin: 3.4 g/dL — ABNORMAL LOW (ref 3.5–5.0)
Alkaline Phosphatase: 83 U/L (ref 38–126)
Anion gap: 6 (ref 5–15)
BILIRUBIN TOTAL: 0.6 mg/dL (ref 0.3–1.2)
BUN: 11 mg/dL (ref 6–20)
CO2: 25 mmol/L (ref 22–32)
Calcium: 9 mg/dL (ref 8.9–10.3)
Chloride: 107 mmol/L (ref 101–111)
Creatinine, Ser: 0.78 mg/dL (ref 0.44–1.00)
GFR calc Af Amer: >60 mL/min (ref >60)
Glucose, Bld: 90 mg/dL (ref 65–99)
Potassium: 3.9 mmol/L (ref 3.5–5.1)
Sodium: 138 mmol/L (ref 135–145)
Total Protein: 6.8 g/dL (ref 6.5–8.1)

## 2016-12-27 LAB — CBC
HEMATOCRIT: 40.6 % (ref 36.0–46.0)
HEMOGLOBIN: 13.6 g/dL (ref 12.0–15.0)
MCH: 27.6 pg (ref 26.0–34.0)
MCHC: 33.5 g/dL (ref 30.0–36.0)
MCV: 82.5 fL (ref 78.0–100.0)
Platelets: 224 10*3/uL (ref 150–400)
RBC: 4.92 MIL/uL (ref 3.87–5.11)
RDW: 14.2 % (ref 11.5–15.5)
WBC: 13.6 10*3/uL — ABNORMAL HIGH (ref 4.0–10.5)

## 2016-12-27 LAB — I-STAT TROPONIN, ED
Troponin i, poc: 0 ng/mL (ref 0.00–0.08)
Troponin i, poc: 0 ng/mL (ref 0.00–0.08)

## 2016-12-27 LAB — LIPASE, BLOOD: LIPASE: 19 U/L (ref 11–51)

## 2016-12-27 MED ORDER — ASPIRIN 81 MG PO CHEW
324.0000 mg | CHEWABLE_TABLET | Freq: Once | ORAL | Status: AC
Start: 2016-12-27 — End: 2016-12-27
  Administered 2016-12-27: 324 mg via ORAL
  Filled 2016-12-27: qty 4

## 2016-12-27 NOTE — Progress Notes (Signed)
**  Preliminary report by tech**  Left lower extremity venous duplex complete. There is no obvious evidence of deep or superficial vein thrombosis involving the left lower extremity. All clearly visualized vessels appear patent and compressible. There is no evidence of a Baker's cyst on the left. Results were given to Dr. Eudelia Bunch.  12/27/16 3:38 PM Olen Cordial RVT

## 2016-12-27 NOTE — ED Provider Notes (Addendum)
[facility]-EMERGENCY DEPT Provider Note   CSN: [id number] Arrival date & time: 12/27/16  1108     History   Chief Complaint Chief Complaint  Patient presents with  . Leg Swelling    HPI [name] is a 46 y.o. female.  The history is provided by the patient.  Chest Pain   This is a recurrent problem. Episode onset: several months. Episode frequency: intermittent. The problem has been resolved. The pain is associated with an emotional upset. The pain is present in the substernal region. The pain is at a severity of 2/10. The quality of the pain is described as heavy. The pain radiates to the left arm. Duration of episode(s) is 15 minutes. Associated symptoms include lower extremity edema (BLE. noted for several months. associated left leg pain that has worsened over the last few weeks.) and shortness of breath. Pertinent negatives include no cough, no fever, no malaise/fatigue and no nausea. Risk factors include smoking/tobacco exposure and obesity.  Pertinent negatives for past medical history include no CAD, no cancer, no diabetes, no DVT, no hyperlipidemia, no hypertension, no MI and no PE.  Pertinent negatives for family medical history include: no early MI.  Procedure history is negative for cardiac catheterization and exercise treadmill test.    Past Medical History:  Diagnosis Date  . Allergy   . Anxiety   . Depression     There are no active problems to display for this patient.   Past Surgical History:  Procedure Laterality Date  . TUBAL LIGATION    . WISDOM TOOTH EXTRACTION      OB History    Gravida Para Term Preterm AB Living   SAB TAB Ectopic Multiple Live Births                   Home Medications    Prior to Admission medications   Medication Sig Start Date End Date Taking? Authorizing Provider  citalopram (CELEXA) 20 MG tablet Take 1 tablet (20 mg total) by mouth daily. Patient not taking: Reported on 12/27/2016 06/04/14   Sherren Mocha,  MD  oxyCODONE-acetaminophen (PERCOCET/ROXICET) 5-325 MG per tablet Take 1-2 tablets by mouth every 4 (four) hours as needed for severe pain. Patient not taking: Reported on 12/27/2016 12/13/14   Armando Reichert, CNM    Family History Family History  Problem Relation Age of Onset  . Diabetes Mother   . Asthma Mother   . Obesity Mother   . Diabetes Paternal Grandmother     Social History Social History  Substance Use Topics  . Smoking status: Current Every Day Smoker    Packs/day: 0.25    Types: Cigarettes  . Smokeless tobacco: Not on file  . Alcohol use No     Allergies   Latex   Review of Systems Review of Systems  Constitutional: Negative for fever and malaise/fatigue.  Respiratory: Positive for shortness of breath. Negative for cough.   Cardiovascular: Positive for chest pain.  Gastrointestinal: Negative for nausea.  All other systems are reviewed and are negative for acute change except as noted in the HPI    Physical Exam Updated Vital Signs BP (!) 114/91 (BP Location: Right Arm)   Pulse 62   Temp 97.9 F (36.6 C) (Oral)   Resp 12   Ht  (1.727 m)   Wt 300 lb (136.1 kg)   LMP 12/19/2016   SpO2 91%   BMI 45.61  kg/m   Physical Exam  Constitutional: She is oriented to person, place, and time. She appears well-developed and well-nourished. No distress.  HENT:  Head: Normocephalic and atraumatic.  Nose: Nose normal.  Eyes: Conjunctivae and EOM are normal. Pupils are equal, round, and reactive to light. Right eye exhibits no discharge. Left eye exhibits no discharge. No scleral icterus.  Neck: Normal range of motion. Neck supple.  Cardiovascular: Normal rate and regular rhythm.  Exam reveals no gallop and no friction rub.   No murmur heard. Pulmonary/Chest: Effort normal and breath sounds normal. No stridor. No respiratory distress. She has no rales.  Abdominal: Soft. She exhibits no distension. There is no tenderness.  Musculoskeletal: She exhibits no  edema.       Left upper leg: She exhibits tenderness.       Left lower leg: She exhibits tenderness. She exhibits no bony tenderness.  BLE nonpitting edema.   Neurological: She is alert and oriented to person, place, and time.  Skin: Skin is warm and dry. No rash noted. She is not diaphoretic. No erythema.  Psychiatric: She has a normal mood and affect.  Vitals reviewed.    ED Treatments / Results  Labs (all labs ordered are listed, but only abnormal results are displayed) Labs Reviewed - No data to display  EKG  EKG Interpretation None       Radiology No results found.  Procedures Procedures (including critical care time)  Medications Ordered in ED Medications - No data to display   Initial Impression / Assessment and Plan / ED Course  I have reviewed the triage vital signs and the nursing notes.  Pertinent labs & imaging results that were available during my care of the patient were reviewed by me and considered in my medical decision making (see chart for details).     1. Chest pain Atypical chest pain most consistent with anxiety however given patient's obesity and history of smoking, will obtain cardiac workup to rule out ACS.  EKG without acute ischemic changes or evidence of pericarditis. Initial troponin negative. HEART <3. Appropriate for delta troponin.  It delta troponin negative, patient should follow-up with her primary care provider for stress testing within 30 days.  Chest x-ray without evidence suggestive of pneumonia, pneumothorax, pneumomediastinum.  No abnormal contour of the mediastinum to suggest dissection. No evidence of acute injuries.  Presentation a classic for aortic dissection or esophageal perforation. Low pretest probability for pulmonary embolism.  2. Bilateral lower extremity edema Patient is concern for blood clots. Ultrasound negative for DVT. Likely secondary to venous insufficiency. Recommended compression garment and elevation.  PCP follow up.    Final Clinical Impressions(s) / ED Diagnoses   Final diagnoses:  Chest pain  Leg swelling      Disposition: Discharge  Condition: Good  I have discussed the results, Dx and Tx plan with the patient who expressed understanding and agree(s) with the plan. Discharge instructions discussed at great length. The patient was given strict return precautions who verbalized understanding of the instructions. No further questions at time of discharge.    New Prescriptions   No medications on file    Follow Up: Primary care provider  Call  To schedule stress testing within 30 days      Nira Conn, MD 12/27/16 1731

## 2016-12-27 NOTE — ED Triage Notes (Signed)
Pt c/o bilateral leg swelling for past few months, with worsening right foot swelling and left calf swelling over the past 4-5 days. Pt is now having left calf tenderness. Pt concerned due to mother passing of PE.

## 2017-02-11 ENCOUNTER — Encounter (HOSPITAL_COMMUNITY): Payer: Self-pay | Admitting: Emergency Medicine

## 2017-02-11 ENCOUNTER — Emergency Department (HOSPITAL_COMMUNITY)
Admission: EM | Admit: 2017-02-11 | Discharge: 2017-02-11 | Disposition: A | Discharge status: Home / Self Care | Payer: Self-pay | Attending: Emergency Medicine | Admitting: Emergency Medicine

## 2017-02-11 ENCOUNTER — Emergency Department (HOSPITAL_COMMUNITY): Payer: Self-pay

## 2017-02-11 DIAGNOSIS — F17210 Nicotine dependence, cigarettes, uncomplicated: Secondary | ICD-10-CM | POA: Insufficient documentation

## 2017-02-11 DIAGNOSIS — N200 Calculus of kidney: Secondary | ICD-10-CM | POA: Insufficient documentation

## 2017-02-11 LAB — URINALYSIS, ROUTINE W REFLEX MICROSCOPIC
BILIRUBIN URINE: NEGATIVE
GLUCOSE, UA: NEGATIVE mg/dL
KETONES UR: 5 mg/dL — AB
NITRITE: NEGATIVE
PH: 5 (ref 5.0–8.0)
Protein, ur: 30 mg/dL — AB
SPECIFIC GRAVITY, URINE: 1.03 (ref 1.005–1.030)

## 2017-02-11 LAB — COMPREHENSIVE METABOLIC PANEL
ALBUMIN: 3.7 g/dL (ref 3.5–5.0)
ALK PHOS: 82 U/L (ref 38–126)
ALT: 16 U/L (ref 14–54)
ANION GAP: 11 (ref 5–15)
AST: 18 U/L (ref 15–41)
BILIRUBIN TOTAL: 0.4 mg/dL (ref 0.3–1.2)
BUN: 16 mg/dL (ref 6–20)
CALCIUM: 9.2 mg/dL (ref 8.9–10.3)
CO2: 22 mmol/L (ref 22–32)
Chloride: 105 mmol/L (ref 101–111)
Creatinine, Ser: 1.07 mg/dL — ABNORMAL HIGH (ref 0.44–1.00)
GFR calc Af Amer: >60 mL/min (ref >60)
GFR calc non Af Amer: >60 mL/min (ref >60)
GLUCOSE: 133 mg/dL — AB (ref 65–99)
Potassium: 3.9 mmol/L (ref 3.5–5.1)
SODIUM: 138 mmol/L (ref 135–145)
TOTAL PROTEIN: 7.2 g/dL (ref 6.5–8.1)

## 2017-02-11 LAB — PREGNANCY, URINE: Preg Test, Ur: NEGATIVE

## 2017-02-11 LAB — LIPASE, BLOOD: Lipase: 23 U/L (ref 11–51)

## 2017-02-11 LAB — CBC
HCT: 40 % (ref 36.0–46.0)
HEMOGLOBIN: 13.3 g/dL (ref 12.0–15.0)
MCH: 27.2 pg (ref 26.0–34.0)
MCHC: 33.3 g/dL (ref 30.0–36.0)
MCV: 81.8 fL (ref 78.0–100.0)
Platelets: 248 10*3/uL (ref 150–400)
RBC: 4.89 MIL/uL (ref 3.87–5.11)
RDW: 14.1 % (ref 11.5–15.5)
WBC: 12.3 10*3/uL — ABNORMAL HIGH (ref 4.0–10.5)

## 2017-02-11 MED ORDER — KETOROLAC TROMETHAMINE 30 MG/ML IJ SOLN
30.0000 mg | Freq: Once | INTRAMUSCULAR | Status: AC
  Administered 2017-02-11: 30 mg via INTRAVENOUS
  Filled 2017-02-11: qty 1

## 2017-02-11 MED ORDER — HYDROMORPHONE HCL 1 MG/ML IJ SOLN
1.0000 mg | Freq: Once | INTRAMUSCULAR | Status: AC
  Administered 2017-02-11: 1 mg via INTRAVENOUS
  Filled 2017-02-11: qty 1

## 2017-02-11 MED ORDER — ONDANSETRON HCL 4 MG/2ML IJ SOLN
4.0000 mg | Freq: Once | INTRAMUSCULAR | Status: AC
  Administered 2017-02-11: 4 mg via INTRAVENOUS
  Filled 2017-02-11: qty 2

## 2017-02-11 MED ORDER — TAMSULOSIN HCL 0.4 MG PO CAPS: 0.4000 mg | ORAL_CAPSULE | Freq: Every day | ORAL | 0 refills | Status: AC

## 2017-02-11 MED ORDER — OXYCODONE-ACETAMINOPHEN 5-325 MG PO TABS: 1.0000 | ORAL_TABLET | ORAL | 0 refills | Status: AC | PRN

## 2017-02-11 NOTE — ED Triage Notes (Signed)
Active vomiting at time of triage.

## 2017-02-11 NOTE — ED Triage Notes (Signed)
Pt reports having right sided flank pain that started at groin and has moved to back and has not had any relief with Motrin. Pt reports prior kidney stone hx. Unable to evaluate urine due to menses.

## 2017-02-11 NOTE — ED Provider Notes (Signed)
[facility]-EMERGENCY DEPT Provider Note   CSN: [id number] Arrival date & time: 02/11/17  0107  By signing my name below, I, Bing NeighborsMaurice Deon Copeland Jr., attest that this documentation has been prepared under the direction and in the presence of No att. providers found. Electronically signed: Bing NeighborsMaurice Deon Copeland Jr., ED Scribe. 02/11/17. 6:12 AM.   History   Chief Complaint Chief Complaint  Patient presents with  . Flank Pain  . Emesis    HPI  [name] is a 46 y.o. female with hx of kidney stone, ovarian cyst who presents to the Emergency Department complaining of R flank pain with onset x1 day. Pt states that she developed R flank pain that radiates to the groin in the past x1 day. She reports hx of kidney stones but states that this is much worse. Pt reports nausea, vomiting. She has taken Motrin with no relief. She denies any modifying factors. Of note, pt is on her monthly menstrual.   The history is provided by the patient. No language interpreter was used.    Past Medical History:  Diagnosis Date  . Allergy   . Anxiety   . Depression     There are no active problems to display for this patient.   Past Surgical History:  Procedure Laterality Date  . TUBAL LIGATION    . WISDOM TOOTH EXTRACTION      OB History    Gravida Para Term Preterm AB Living   2 2 2     2    SAB TAB Ectopic Multiple Live Births                   Home Medications    Prior to Admission medications   Medication Sig Start Date End Date Taking? Authorizing Provider  oxyCODONE-acetaminophen (PERCOCET) 5-325 MG tablet Take 1-2 tablets by mouth every 4 (four) hours as needed. 02/11/17   Gilda CreasePollina, Socorro Ebron J, MD  tamsulosin (FLOMAX) 0.4 MG CAPS capsule Take 1 capsule (0.4 mg total) by mouth daily. 02/11/17   Gilda CreasePollina, Chanoch Mccleery J, MD    Family History Family History  Problem Relation Age of Onset  . Diabetes Mother   . Asthma Mother   . Obesity Mother   . Diabetes Paternal  Grandmother     Social History Social History  Substance Use Topics  . Smoking status: Current Every Day Smoker    Packs/day: 0.25    Types: Cigarettes  . Smokeless tobacco: Not on file  . Alcohol use No     Allergies   Latex   Review of Systems Review of Systems  Constitutional: Negative for fever.  Gastrointestinal: Positive for nausea and vomiting.  Genitourinary: Positive for flank pain (R).     Physical Exam Updated Vital Signs BP 136/70 (BP Location: Left Arm)   Pulse 74   Temp 98.7 F (37.1 C) (Oral)   Resp 14   Ht 5\' 8"  (1.727 m)   Wt 136.1 kg (300 lb)   LMP 02/10/2017 (Exact Date)   SpO2 99%   BMI 45.61 kg/m   Physical Exam  Constitutional: She is oriented to person, place, and time. She appears well-developed and well-nourished. No distress.  HENT:  Head: Normocephalic and atraumatic.  Right Ear: Hearing normal.  Left Ear: Hearing normal.  Nose: Nose normal.  Mouth/Throat: Oropharynx is clear and moist and mucous membranes are normal.  Eyes: Conjunctivae and EOM are normal. Pupils are equal, round, and reactive to light.  Neck: Normal range of motion. Neck  supple.  Cardiovascular: Regular rhythm, S1 normal and S2 normal.  Exam reveals no gallop and no friction rub.   No murmur heard. Pulmonary/Chest: Effort normal and breath sounds normal. No respiratory distress. She exhibits no tenderness.  Abdominal: Soft. Normal appearance and bowel sounds are normal. There is no hepatosplenomegaly. There is no tenderness. There is CVA tenderness. There is no rebound, no guarding, no tenderness at McBurney's point and negative Murphy's sign. No hernia.  Slight R CVA tenderness.   Musculoskeletal: Normal range of motion.  Neurological: She is alert and oriented to person, place, and time. She has normal strength. No cranial nerve deficit or sensory deficit. Coordination normal. GCS eye subscore is 4. GCS verbal subscore is 5. GCS motor subscore is 6.  Skin: Skin  is warm, dry and intact. No rash noted. No cyanosis.  Psychiatric: She has a normal mood and affect. Her speech is normal and behavior is normal. Thought content normal.  Nursing note and vitals reviewed.    ED Treatments / Results   DIAGNOSTIC STUDIES: Oxygen Saturation is 95% on RA, inadequate by my interpretation.   COORDINATION OF CARE: 6:12 AM-Discussed next steps with pt. Pt verbalized understanding and is agreeable with the plan.    Labs (all labs ordered are listed, but only abnormal results are displayed) Labs Reviewed  COMPREHENSIVE METABOLIC PANEL - Abnormal; Notable for the following:       Result Value   Glucose, Bld 133 (*)    Creatinine, Ser 1.07 (*)    All other components within normal limits  CBC - Abnormal; Notable for the following:    WBC 12.3 (*)    All other components within normal limits  URINALYSIS, ROUTINE W REFLEX MICROSCOPIC - Abnormal; Notable for the following:    Color, Urine AMBER (*)    APPearance CLOUDY (*)    Hgb urine dipstick LARGE (*)    Ketones, ur 5 (*)    Protein, ur 30 (*)    Leukocytes, UA SMALL (*)    Bacteria, UA RARE (*)    Squamous Epithelial / LPF 6-30 (*)    All other components within normal limits  LIPASE, BLOOD  PREGNANCY, URINE    EKG  EKG Interpretation None       Radiology Ct Renal Stone Study  Result Date: 02/11/2017 CLINICAL DATA:  Right flank pain radiating to the groin. Nausea and vomiting. White cell count 12.3. EXAM: CT ABDOMEN AND PELVIS WITHOUT CONTRAST TECHNIQUE: Multidetector CT imaging of the abdomen and pelvis was performed following the standard protocol without IV contrast. COMPARISON:  12/01/2014 FINDINGS: Lower chest: The lung bases are clear. Hepatobiliary: No focal liver abnormality is seen. No gallstones, gallbladder wall thickening, or biliary dilatation. Pancreas: Unremarkable. No pancreatic ductal dilatation or surrounding inflammatory changes. Spleen: Normal in size without focal  abnormality. Adrenals/Urinary Tract: No adrenal gland nodules. 3 mm stone in the distal right ureter with mild right hydronephrosis and hydroureter. Mild stranding around the right kidney and ureter. Additional tiny stones demonstrated in the right kidney and left kidney. No left hydronephrosis. Bladder wall is not thickened. Stomach/Bowel: Stomach, small bowel, and colon are decompressed with scattered stool seen in the colon. No inflammatory changes are appreciated. Appendix is not identified. Vascular/Lymphatic: No significant vascular findings are present. No enlarged abdominal or pelvic lymph nodes. Reproductive: Uterus and bilateral adnexa are unremarkable. Other: No abdominal wall hernia or abnormality. No abdominopelvic ascites. Musculoskeletal: No acute or significant osseous findings. IMPRESSION: 3 mm stone in the distal  right ureter with moderate proximal obstruction. Additional bilateral nonobstructing intrarenal stones bilaterally. Electronically Signed   By: Burman Nieves M.D.   On: 02/11/2017 02:55    Procedures Procedures (including critical care time)  Medications Ordered in ED Medications  ondansetron (ZOFRAN) injection 4 mg (4 mg Intravenous Given 02/11/17 0143)  HYDROmorphone (DILAUDID) injection 1 mg (1 mg Intravenous Given 02/11/17 0143)  ketorolac (TORADOL) 30 MG/ML injection 30 mg (30 mg Intravenous Given 02/11/17 0352)     Initial Impression / Assessment and Plan / ED Course  I have reviewed the triage vital signs and the nursing notes.  Pertinent labs & imaging results that were available during my care of the patient were reviewed by me and considered in my medical decision making (see chart for details).     Patient presents with right flank pain. She is complaining of pain in the lower back that radiates into the right groin. She has had associated nausea and vomiting. Workup reveals 3 mm distal ureteral stone with mild hydronephrosis. Patient has achieved good pain  control with Dilaudid and Toradol. She was prescribed Flomax, Percocet and is to follow-up with urology as an outpatient.  Final Clinical Impressions(s) / ED Diagnoses   Final diagnoses:  Kidney stone    New Prescriptions Discharge Medication List as of 02/11/2017  5:05 AM    START taking these medications   Details  oxyCODONE-acetaminophen (PERCOCET) 5-325 MG tablet Take 1-2 tablets by mouth every 4 (four) hours as needed., Starting Fri 02/11/2017, Print    tamsulosin (FLOMAX) 0.4 MG CAPS capsule Take 1 capsule (0.4 mg total) by mouth daily., Starting Fri 02/11/2017, Print       I personally performed the services described in this documentation, which was scribed in my presence. The recorded information has been reviewed and is accurate.      Gilda Crease, MD 02/11/17 [phone]

## 2017-11-23 ENCOUNTER — Encounter (HOSPITAL_COMMUNITY): Payer: Self-pay | Admitting: *Deleted

## 2017-11-23 ENCOUNTER — Emergency Department (HOSPITAL_BASED_OUTPATIENT_CLINIC_OR_DEPARTMENT_OTHER)
Admit: 2017-11-23 | Discharge: 2017-11-23 | Disposition: A | Discharge status: Home / Self Care | Payer: Self-pay | Attending: Emergency Medicine | Admitting: Emergency Medicine

## 2017-11-23 ENCOUNTER — Emergency Department (HOSPITAL_COMMUNITY)
Admission: EM | Admit: 2017-11-23 | Discharge: 2017-11-23 | Disposition: A | Discharge status: Home / Self Care | Payer: Self-pay | Attending: Emergency Medicine | Admitting: Emergency Medicine

## 2017-11-23 ENCOUNTER — Other Ambulatory Visit: Payer: Self-pay

## 2017-11-23 ENCOUNTER — Emergency Department (HOSPITAL_COMMUNITY): Payer: Self-pay

## 2017-11-23 DIAGNOSIS — M79602 Pain in left arm: Secondary | ICD-10-CM | POA: Insufficient documentation

## 2017-11-23 DIAGNOSIS — Z79899 Other long term (current) drug therapy: Secondary | ICD-10-CM | POA: Insufficient documentation

## 2017-11-23 DIAGNOSIS — Z91040 Latex allergy status: Secondary | ICD-10-CM | POA: Insufficient documentation

## 2017-11-23 DIAGNOSIS — M79609 Pain in unspecified limb: Secondary | ICD-10-CM

## 2017-11-23 DIAGNOSIS — R2232 Localized swelling, mass and lump, left upper limb: Secondary | ICD-10-CM | POA: Insufficient documentation

## 2017-11-23 DIAGNOSIS — M7989 Other specified soft tissue disorders: Secondary | ICD-10-CM

## 2017-11-23 DIAGNOSIS — F17210 Nicotine dependence, cigarettes, uncomplicated: Secondary | ICD-10-CM | POA: Insufficient documentation

## 2017-11-23 LAB — BASIC METABOLIC PANEL
ANION GAP: 8 (ref 5–15)
BUN: 12 mg/dL (ref 6–20)
CO2: 23 mmol/L (ref 22–32)
Calcium: 8.7 mg/dL — ABNORMAL LOW (ref 8.9–10.3)
Chloride: 106 mmol/L (ref 101–111)
Creatinine, Ser: 0.93 mg/dL (ref 0.44–1.00)
GFR calc non Af Amer: >60 mL/min (ref >60)
Glucose, Bld: 98 mg/dL (ref 65–99)
Potassium: 4.1 mmol/L (ref 3.5–5.1)
SODIUM: 137 mmol/L (ref 135–145)

## 2017-11-23 LAB — I-STAT BETA HCG BLOOD, ED (MC, WL, AP ONLY)

## 2017-11-23 LAB — CBC
HEMATOCRIT: 40.4 % (ref 36.0–46.0)
HEMOGLOBIN: 13 g/dL (ref 12.0–15.0)
MCH: 27.7 pg (ref 26.0–34.0)
MCHC: 32.2 g/dL (ref 30.0–36.0)
MCV: 86.1 fL (ref 78.0–100.0)
Platelets: ADEQUATE 10*3/uL (ref 150–400)
RBC: 4.69 MIL/uL (ref 3.87–5.11)
RDW: 14.4 % (ref 11.5–15.5)
WBC: 13.6 10*3/uL — AB (ref 4.0–10.5)

## 2017-11-23 LAB — I-STAT TROPONIN, ED: TROPONIN I, POC: 0 ng/mL (ref 0.00–0.08)

## 2017-11-23 MED ORDER — ALBUTEROL SULFATE (2.5 MG/3ML) 0.083% IN NEBU
5.0000 mg | INHALATION_SOLUTION | Freq: Once | RESPIRATORY_TRACT | Status: AC
  Administered 2017-11-23: 5 mg via RESPIRATORY_TRACT
  Filled 2017-11-23: qty 6

## 2017-11-23 NOTE — Discharge Instructions (Signed)
You may alternate taking Tylenol and Ibuprofen as needed for pain control for your arm pain. You may take 400-600 mg of ibuprofen every 6 hours and [phone] mg of Tylenol every 6 hours. Do not exceed 4000 mg of Tylenol daily as this can lead to liver damage. Also, make sure to take Ibuprofen with meals as it can cause an upset stomach. Do not take other NSAIDs while taking Ibuprofen such as (Aleve, Naprosyn, Aspirin, Celebrex, etc) and do not take more than the prescribed dose as this can lead to ulcers and bleeding in your GI tract. You may use warm and cold compresses to help with your symptoms.   Please follow up with your primary doctor within the next 7-10 days for re-evaluation and further treatment of your symptoms. If you do not have a primary care doctor you may follow up with the [facility] for further evaluation. Contact information provided in discharge instructions.  Please return to the ER sooner if you have any chest pain, shortness of breath, or any new or worsening symptoms.

## 2017-11-23 NOTE — Progress Notes (Signed)
LUE venous duplex prelim: negative for DVT and SVT. Iran Kievit Eunice, RDMS, RVT   

## 2017-11-23 NOTE — ED Triage Notes (Signed)
Pt states her left arm has been hurting for several days, now has swelling, good pulse, Cap refills good, family history of PEs, pt concerned. SHOB x 2 weeks

## 2017-11-23 NOTE — ED Provider Notes (Signed)
[facility]-EMERGENCY DEPT Provider Note   CSN: [id number] Arrival date & time: 11/23/17  0715     History   Chief Complaint Chief Complaint  Patient presents with  . Arm Swelling    Left    HPI [name] is a 47 y.o. female.  HPI   Patient is a 47 year old female who presents the ED today complaining of 3/10 dull, constant, posterior left arm pain for the last 1-2 weeks.  She also complains of left upper arm swelling for the last week as well as shortness of breath, chest tightness and cough for the last week. She does state that she has had these same symptoms intermittently for a year. Denies any fevers, rhinorrhea, nasal congestion, sore throat.  Denies chest pain or lower extremity swelling.  Does endorse bilateral calf muscle soreness, that she states she has every time she has a long shift at work.  Patient just left work PTA and had a night shift. She states calf pain consistent with normal muscle soreness after work. No calf swelling or redness.  Denies recent surgery/trauma, recent long travel, hormone use, personal hx of cancer, or personal hx of DVT/PE. Does endorse that her mother had h/o PE.    Past Medical History:  Diagnosis Date  . Allergy   . Anxiety   . Depression     There are no active problems to display for this patient.   Past Surgical History:  Procedure Laterality Date  . HAND SURGERY    . TUBAL LIGATION    . WISDOM TOOTH EXTRACTION      OB History    Gravida Para Term Preterm AB Living   2 2 2     2    SAB TAB Ectopic Multiple Live Births                   Home Medications    Prior to Admission medications   Medication Sig Start Date End Date Taking? Authorizing Provider  oxyCODONE-acetaminophen (PERCOCET) 5-325 MG tablet Take 1-2 tablets by mouth every 4 (four) hours as needed. 02/11/17   Gilda CreasePollina, Christopher J, MD  tamsulosin (FLOMAX) 0.4 MG CAPS capsule Take 1 capsule (0.4 mg total) by mouth daily.  02/11/17   Gilda CreasePollina, Christopher J, MD    Family History Family History  Problem Relation Age of Onset  . Diabetes Mother   . Asthma Mother   . Obesity Mother   . Diabetes Paternal Grandmother     Social History Social History   Tobacco Use  . Smoking status: Current Every Day Smoker    Packs/day: 0.25    Types: Cigarettes  . Smokeless tobacco: Never Used  Substance Use Topics  . Alcohol use: No  . Drug use: No     Allergies   Latex   Review of Systems Review of Systems  Constitutional: Negative for fever.  HENT: Negative for congestion, rhinorrhea and sore throat.   Eyes: Negative for visual disturbance.  Respiratory: Positive for cough and shortness of breath.   Cardiovascular: Negative for chest pain and leg swelling.       Chest tightness  Gastrointestinal: Negative for constipation, diarrhea, nausea and vomiting.  Genitourinary: Negative for decreased urine volume and flank pain.  Musculoskeletal:       Pain to bilat calves, no calf swelling  Skin: Negative for rash.  Neurological: Negative for dizziness, light-headedness and headaches.     Physical Exam Updated Vital Signs BP (!) 142/65  Pulse 76   Temp 98.2 F (36.8 C) (Oral)   Resp 18   Ht 5\' 8"  (1.727 m)   Wt (!) 140.6 kg (310 lb)   LMP 11/21/2017   SpO2 100%   BMI 47.14 kg/m   Physical Exam  Constitutional: She appears well-developed and well-nourished. No distress.  Morbidly obese  HENT:  Head: Normocephalic and atraumatic.  Eyes: Conjunctivae and EOM are normal. Pupils are equal, round, and reactive to light.  Neck: Neck supple.  Cardiovascular: Normal rate, regular rhythm, normal heart sounds and intact distal pulses.  No murmur heard. Pulmonary/Chest: Effort normal and breath sounds normal. No stridor. No respiratory distress. She has no wheezes.  No tachypnea  Abdominal: Soft. There is no tenderness.  Musculoskeletal: She exhibits no edema.  Mild ttp to superior posterior aspect  of LUE. No obvious swelling to LUE compared to left, however difficult to assess due to body habitus, distal pulses and sensation intact and symmetric bilat. Normal rom  Neurological: She is alert.  Skin: Skin is warm and dry.  Psychiatric: She has a normal mood and affect.  Nursing note and vitals reviewed.    ED Treatments / Results  Labs (all labs ordered are listed, but only abnormal results are displayed) Labs Reviewed  CBC - Abnormal; Notable for the following components:      Result Value   WBC 13.6 (*)    All other components within normal limits  BASIC METABOLIC PANEL - Abnormal; Notable for the following components:   Calcium 8.7 (*)    All other components within normal limits  I-STAT BETA HCG BLOOD, ED ([facility], [facility], [facility] ONLY)  I-STAT TROPONIN, ED    EKG  EKG Interpretation  Date/Time:  Wednesday November 23 2017 07:31:06 EST Ventricular Rate:  86 PR Interval:    QRS Duration: 94 QT Interval:  359 QTC Calculation: 430 R Axis:   -62 Text Interpretation:  Sinus rhythm Left anterior fascicular block Abnormal R-wave progression, late transition no significant change since April 2018 Confirmed by Pricilla Loveless [phone]) on 11/23/2017 7:47:19 AM Also confirmed by Pricilla Loveless [phone]), editor Lanae Boast [phone])  on 11/23/2017 7:52:13 AM       Radiology Dg Chest 2 View  Result Date: 11/23/2017 CLINICAL DATA:  Shortness of breath, chest pain. EXAM: CHEST - 2 VIEW COMPARISON:  Radiographs of December 27, 2016. FINDINGS: The heart size and mediastinal contours are within normal limits. Both lungs are clear. No pneumothorax or pleural effusion is noted. The visualized skeletal structures are unremarkable. IMPRESSION: No active cardiopulmonary disease. Electronically Signed   By: Lupita Raider, M.D.   On: 11/23/2017 08:23    Procedures Procedures (including critical care time)  Medications Ordered in ED Medications  albuterol (PROVENTIL) (2.5 MG/3ML) 0.083% nebulizer solution 5  mg (5 mg Nebulization Given 11/23/17 0952)     Initial Impression / Assessment and Plan / ED Course  I have reviewed the triage vital signs and the nursing notes.  Pertinent labs & imaging results that were available during my care of the patient were reviewed by me and considered in my medical decision making (see chart for details).    Discussed pt presentation and exam findings with Dr. Criss Alvine, who personally evaluated the pt and agrees with workup and plan for d/c.   Final Clinical Impressions(s) / ED Diagnoses   Final diagnoses:  Left arm pain  Left arm swelling   47 y/o F with LUE swelling/pain, sob, and cough for 1 week.  Family h/o VTE. No other risk factors for VTE. VSS, NAD. Satting at 100% on RA.   VSS. NAD. No obvious swelling to LUE on exam. Pulmonary exam WNL before and after neb tx.  Labs with mild elevation of WBC to 13.6 however this appears baseline for pt and she has no infectious sxs. BMP Negative.  CXR with no active cardiopulmonary disease. No evidence of PNA, cardiomegaly or PTX. ECG with NSR and left anterior fascicular block, but appears consistent with previous. No ischemic changes. No S1Q3T3. LUE venous duplex prelim: negative for DVT and SVT. Lab work reassuring and trop negative x1. Doubt ACS. Unclear etiology of LUE swelling and pain, however likely musculoskeletal at this point. Cough and SOB seem chronic for patient and unlikely due to acute cardiopulmonary process. Doubt PE. PERC negative.   Pts workup benign and doubt emergent or life threatening etiology of sxs at this time. Safe to tx pt with antiinflammatories for arm pain and have her f/u with her pcp as outpt. Pt declined Rx for this. Return precautions given for new or worsening sxs.   ED Discharge Orders    None       Rayne Du 11/23/17 1110    Pricilla Loveless, MD 11/23/17 [phone]

## 2018-05-19 ENCOUNTER — Encounter (HOSPITAL_COMMUNITY): Payer: Self-pay

## 2018-05-19 ENCOUNTER — Emergency Department (HOSPITAL_COMMUNITY): Payer: Self-pay

## 2018-05-19 ENCOUNTER — Other Ambulatory Visit: Payer: Self-pay

## 2018-05-19 ENCOUNTER — Emergency Department (HOSPITAL_COMMUNITY)
Admission: EM | Admit: 2018-05-19 | Discharge: 2018-05-19 | Disposition: A | Discharge status: Home / Self Care | Payer: Self-pay | Attending: Emergency Medicine | Admitting: Emergency Medicine

## 2018-05-19 DIAGNOSIS — R0789 Other chest pain: Secondary | ICD-10-CM | POA: Insufficient documentation

## 2018-05-19 DIAGNOSIS — F17210 Nicotine dependence, cigarettes, uncomplicated: Secondary | ICD-10-CM | POA: Insufficient documentation

## 2018-05-19 DIAGNOSIS — Z79899 Other long term (current) drug therapy: Secondary | ICD-10-CM | POA: Insufficient documentation

## 2018-05-19 LAB — BASIC METABOLIC PANEL
ANION GAP: 10 (ref 5–15)
BUN: 11 mg/dL (ref 6–20)
CHLORIDE: 109 mmol/L (ref 98–111)
CO2: 19 mmol/L — AB (ref 22–32)
CREATININE: 0.94 mg/dL (ref 0.44–1.00)
Calcium: 9 mg/dL (ref 8.9–10.3)
GFR calc non Af Amer: >60 mL/min (ref >60)
Glucose, Bld: 117 mg/dL — ABNORMAL HIGH (ref 70–99)
POTASSIUM: 3.9 mmol/L (ref 3.5–5.1)
Sodium: 138 mmol/L (ref 135–145)

## 2018-05-19 LAB — CBC
HEMATOCRIT: 42.5 % (ref 36.0–46.0)
HEMOGLOBIN: 13.5 g/dL (ref 12.0–15.0)
MCH: 27.4 pg (ref 26.0–34.0)
MCHC: 31.8 g/dL (ref 30.0–36.0)
MCV: 86.2 fL (ref 78.0–100.0)
Platelets: 258 10*3/uL (ref 150–400)
RBC: 4.93 MIL/uL (ref 3.87–5.11)
RDW: 14.3 % (ref 11.5–15.5)
WBC: 13.4 10*3/uL — ABNORMAL HIGH (ref 4.0–10.5)

## 2018-05-19 LAB — I-STAT TROPONIN, ED: Troponin i, poc: 0 ng/mL (ref 0.00–0.08)

## 2018-05-19 LAB — I-STAT BETA HCG BLOOD, ED (MC, WL, AP ONLY): I-stat hCG, quantitative: <5 m[IU]/mL (ref <5)

## 2018-05-19 NOTE — ED Provider Notes (Signed)
Patient placed in Quick Look pathway, seen and evaluated   Chief Complaint: chest pain  HPI:   [name] is a 47 y.o. female with hx of anxiety who presents to the ED with chest pain and right arm pain. Pt states that she has been having CP for a couple of weeks but has not been evaluated yet thinking it was stress. Pt reports starting yesterday she started to have numbness in her left fingers with SOB. Pt took BP at work and found it to be elevated. patient describes the chest pain as a squeezing pressure  ROS: CV: chest pain  GI: nausea   Physical Exam:  BP (!) 160/88 (BP Location: Right Arm)   Pulse 95   Temp 99.7 F (37.6 C) (Oral)   Resp 20   Ht 5\' 8"  (1.727 m)   Wt (!) 158.8 kg   LMP 04/23/2018   SpO2 97%   BMI 53.22 kg/m    Neuro: Awake and Alert  Skin: Warm and dry  Heart: tachycardia  Lungs: clear      Initiation of care has begun. The patient has been counseled on the process, plan, and necessity for staying for the completion/evaluation, and the remainder of the medical screening examination    Janne Napoleoneese, Mishon Blubaugh M, NP 05/19/18 1744    Azalia Bilisampos, Kevin, MD 05/20/18 [phone]

## 2018-05-19 NOTE — ED Triage Notes (Signed)
Pt states that she has been having CP for a couple of weeks but has not been evaluated yet thinking it was stress. Pt reports starting yesterday she started to have numbness in her left fingers with SOB. Pt took BP at work and found it to be elevated.

## 2018-05-19 NOTE — Discharge Instructions (Addendum)
Follow up with Cardiology if symptoms are not improving in the next two days.  Ibuprofen 600 mg every 6 hours as needed for pain.  Return to the emergency department in the meantime if you develop worsening pain, difficulty breathing, or other new and concerning symptoms.

## 2018-05-19 NOTE — ED Provider Notes (Signed)
[facility] EMERGENCY DEPARTMENT Provider Note   CSN: [id number] Arrival date & time: 05/19/18  1720     History   Chief Complaint Chief Complaint  Patient presents with  . Chest Pain    HPI [name] is a 47 y.o. female.  Patient is a 47 year old female with history of anxiety and depression.  She presents today for evaluation of chest discomfort.  This has been ongoing intermittently for the past 2 weeks.  She describes a tightness in the upper part of her chest and numbness to the fingertips of the left hand.  This is been occurring mainly at work.  She reports checking her blood pressure on several occasions today and noting that it was elevated.  She denies nausea but does report some occasional shortness of breath.    She has no prior cardiac history and cardiac risk factors include obesity and tobacco use.  The history is provided by the patient.  Chest Pain   This is a new problem. Episode onset: 2 weeks ago. Episode frequency: Intermittently. The problem has not changed since onset.The pain is present in the substernal region. The pain is mild. The quality of the pain is described as pressure-like. The pain does not radiate. Pertinent negatives include no diaphoresis, no nausea and no shortness of breath. She has tried nothing for the symptoms. Risk factors include obesity.    Past Medical History:  Diagnosis Date  . Allergy   . Anxiety   . Depression     There are no active problems to display for this patient.   Past Surgical History:  Procedure Laterality Date  . HAND SURGERY    . TUBAL LIGATION    . WISDOM TOOTH EXTRACTION       OB History    Gravida  2   Para  2   Term  2   Preterm      AB      Living  2     SAB      TAB      Ectopic      Multiple      Live Births               Home Medications    Prior to Admission medications   Medication Sig Start Date End Date Taking? Authorizing Provider    acetaminophen (TYLENOL) 500 MG tablet Take 500 mg by mouth daily as needed for moderate pain.    [provider]  oxyCODONE-acetaminophen (PERCOCET) 5-325 MG tablet Take 1-2 tablets by mouth every 4 (four) hours as needed. Patient not taking: Reported on 11/23/2017 02/11/17   Gilda Crease, MD  tamsulosin (FLOMAX) 0.4 MG CAPS capsule Take 1 capsule (0.4 mg total) by mouth daily. Patient not taking: Reported on 11/23/2017 02/11/17   Gilda Crease, MD    Family History Family History  Problem Relation Age of Onset  . Diabetes Mother   . Asthma Mother   . Obesity Mother   . Diabetes Paternal Grandmother     Social History Social History   Tobacco Use  . Smoking status: Current Every Day Smoker    Packs/day: 0.25    Types: Cigarettes  . Smokeless tobacco: Never Used  Substance Use Topics  . Alcohol use: No  . Drug use: No     Allergies   Latex   Review of Systems Review of Systems  Constitutional: Negative for diaphoresis.  Respiratory: Negative for shortness of breath.  Cardiovascular: Positive for chest pain.  Gastrointestinal: Negative for nausea.  All other systems reviewed and are negative.    Physical Exam Updated Vital Signs BP (!) 153/73 (BP Location: Left Arm)   Pulse 74   Temp 99.7 F (37.6 C) (Oral)   Resp 14   Ht 5\' 8"  (1.727 m)   Wt (!) 158.8 kg   LMP 04/23/2018   SpO2 100%   BMI 53.22 kg/m   Physical Exam  Constitutional: She is oriented to person, place, and time. She appears well-developed and well-nourished. No distress.  HENT:  Head: Normocephalic and atraumatic.  Neck: Normal range of motion. Neck supple.  Cardiovascular: Normal rate and regular rhythm. Exam reveals no gallop and no friction rub.  No murmur heard. Pulmonary/Chest: Effort normal and breath sounds normal. No respiratory distress. She has no wheezes.  Abdominal: Soft. Bowel sounds are normal. She exhibits no distension. There is no tenderness.   Musculoskeletal: Normal range of motion.  Neurological: She is alert and oriented to person, place, and time.  Skin: Skin is warm and dry. She is not diaphoretic.  Nursing note and vitals reviewed.    ED Treatments / Results  Labs (all labs ordered are listed, but only abnormal results are displayed) Labs Reviewed  BASIC METABOLIC PANEL - Abnormal; Notable for the following components:      Result Value   CO2 19 (*)    Glucose, Bld 117 (*)    All other components within normal limits  CBC - Abnormal; Notable for the following components:   WBC 13.4 (*)    All other components within normal limits  I-STAT TROPONIN, ED  I-STAT BETA HCG BLOOD, ED ([facility], [facility], [facility] ONLY)    EKG EKG Interpretation  Date/Time:  Friday May 19 2018 17:32:25 EDT Ventricular Rate:  105 PR Interval:  146 QRS Duration: 94 QT Interval:  348 QTC Calculation: 459 R Axis:   -76 Text Interpretation:  Sinus tachycardia Left anterior fascicular block Abnormal ECG No STEMI.  Confirmed by Alona Bene [phone]) on 05/19/2018 10:52:52 PM   Radiology Dg Chest 2 View  Result Date: 05/19/2018 CLINICAL DATA:  Chest pain EXAM: CHEST - 2 VIEW COMPARISON:  11/23/2017 FINDINGS: The heart size and mediastinal contours are within normal limits. Both lungs are clear. The visualized skeletal structures are unremarkable. IMPRESSION: No active cardiopulmonary disease. Electronically Signed   By: Marlan Palau M.D.   On: 05/19/2018 19:08    Procedures Procedures (including critical care time)  Medications Ordered in ED Medications - No data to display   Initial Impression / Assessment and Plan / ED Course  I have reviewed the triage vital signs and the nursing notes.  Pertinent labs & imaging results that were available during my care of the patient were reviewed by me and considered in my medical decision making (see chart for details).  Patient presents with complaints of chest discomfort.  Her symptoms are atypical  for cardiac pain and work-up is unremarkable.  Her EKG is unchanged from 5 months ago and troponin is negative despite 2 weeks of symptoms.  She reports increased stress in her life, and I suspect this is the cause.  She has never had a baseline stress test and I feel as though she may benefit from this.  She will be given the number for the cardiology clinic and is to follow-up with them in the near future.  She is to return in the meantime if symptoms worsen or change.  Final  Clinical Impressions(s) / ED Diagnoses   Final diagnoses:  None    ED Discharge Orders    None       Geoffery Lyonselo, Sativa Gelles, MD 05/19/18 2325

## 2018-11-12 IMAGING — CR DG CHEST 2V
2 series · 2 of 2 positions shown · non-contrast
Comparison: Radiographs December 27, 2016.

CLINICAL DATA: Shortness of breath, chest pain.

EXAM:
CHEST - 2 VIEW

[w chest pa]
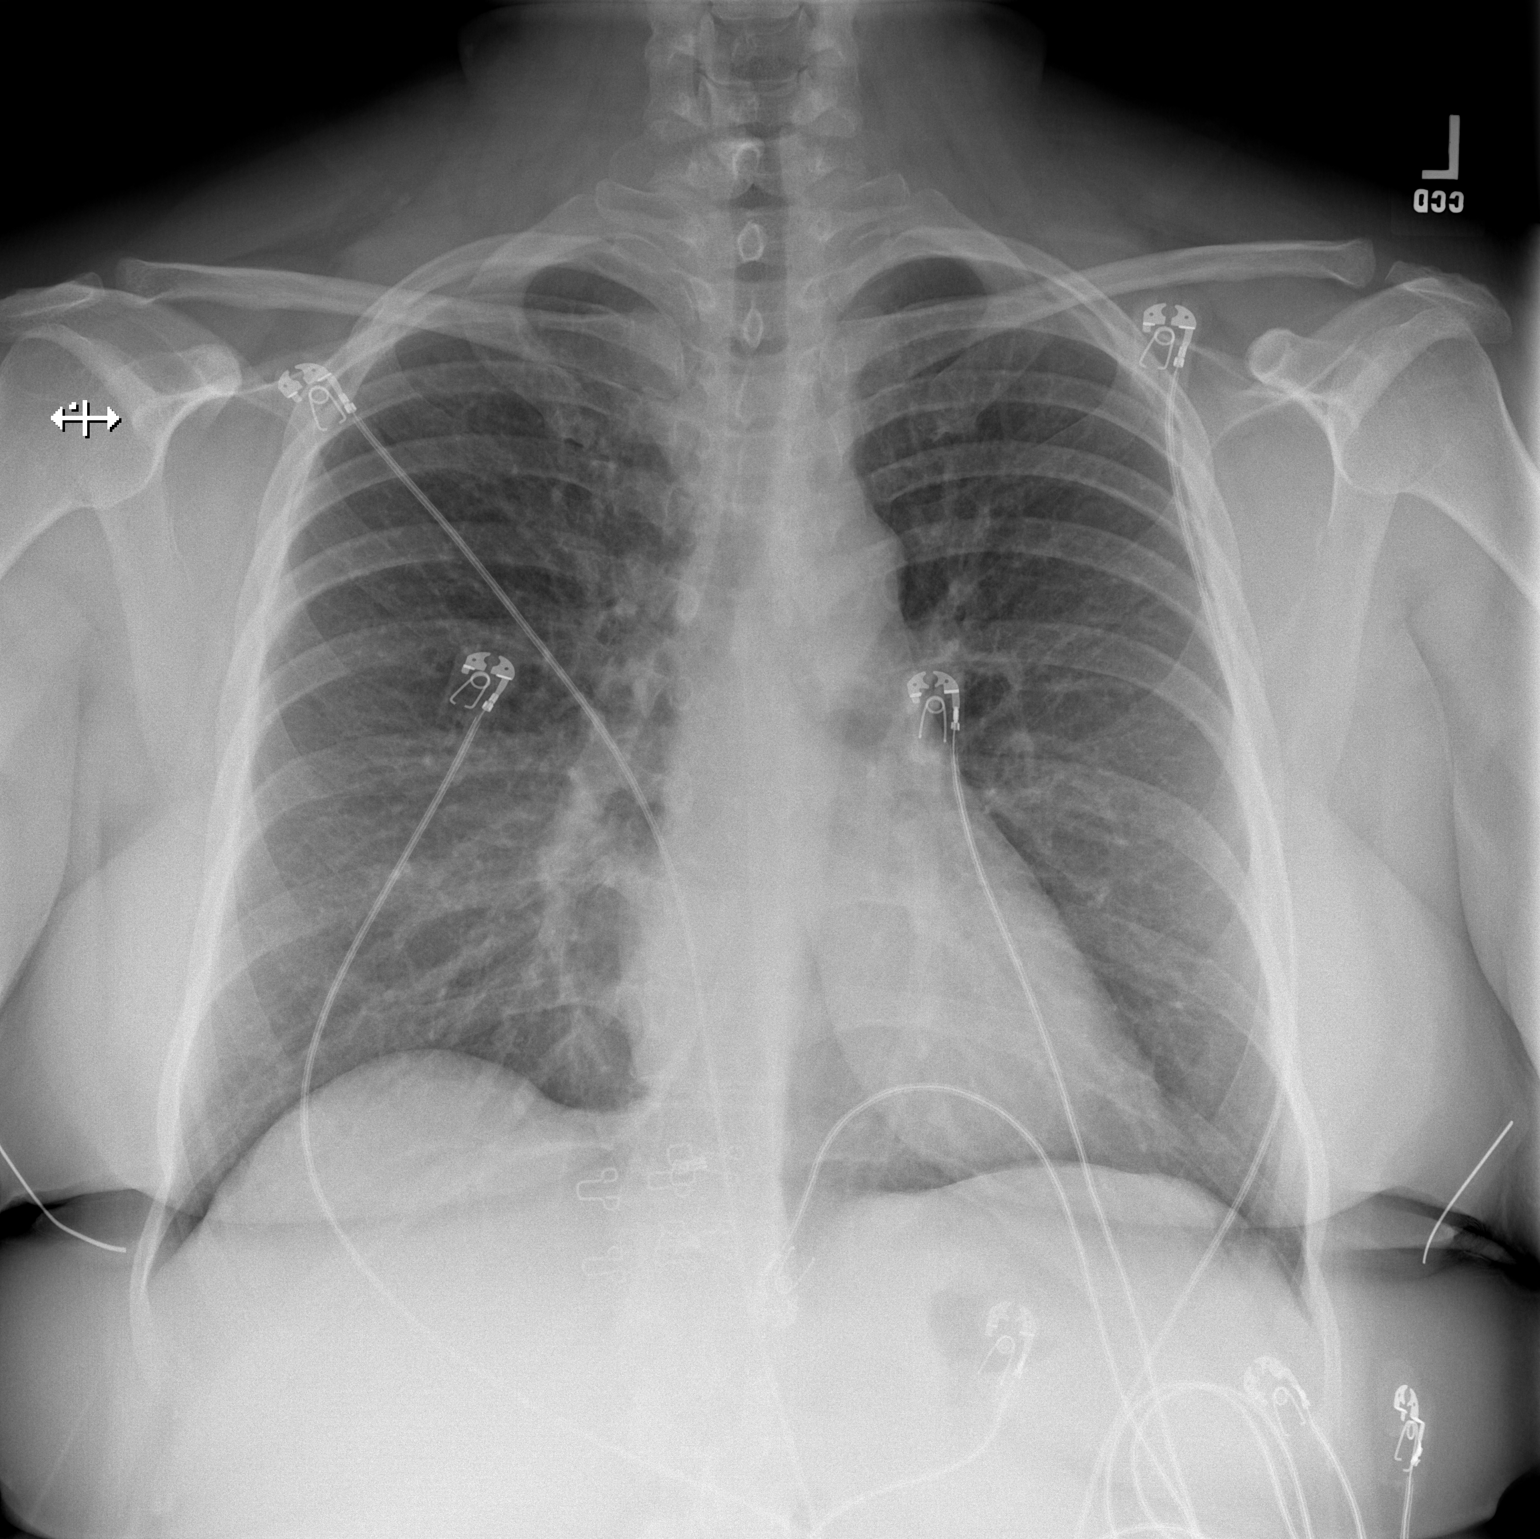

[w chest lat]
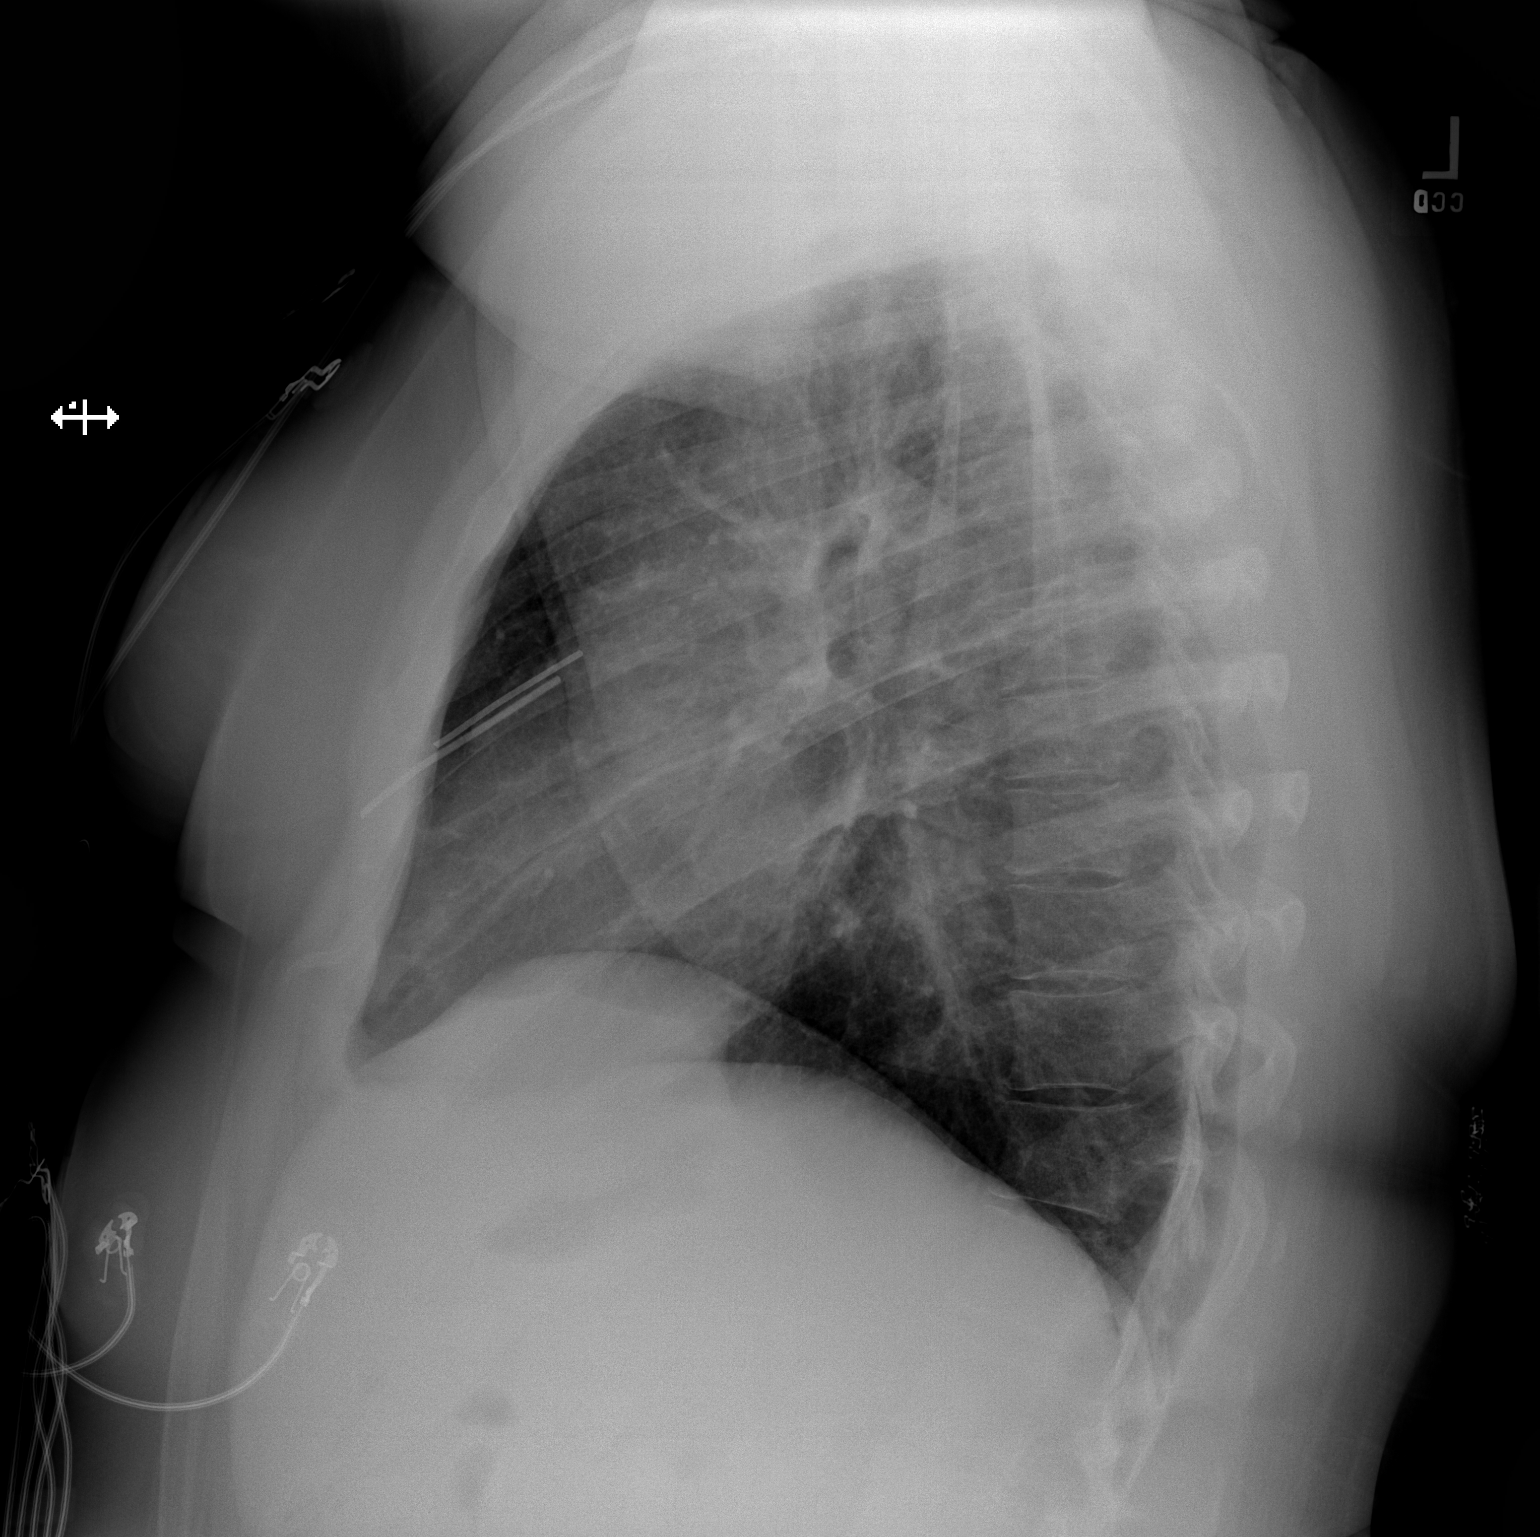

[2 of 2 positions shown; findings below may reference images not displayed]

FINDINGS: The heart size and mediastinal contours are within normal limits.
Both lungs are clear. No pneumothorax or pleural effusion is noted.
The visualized skeletal structures are unremarkable.
IMPRESSION: No active cardiopulmonary disease.

## 2021-12-17 ENCOUNTER — Other Ambulatory Visit: Payer: Self-pay

## 2021-12-17 ENCOUNTER — Emergency Department (HOSPITAL_BASED_OUTPATIENT_CLINIC_OR_DEPARTMENT_OTHER)
Admission: EM | Admit: 2021-12-17 | Discharge: 2021-12-17 | Disposition: A | Discharge status: Home / Self Care | Payer: Self-pay | Attending: Emergency Medicine | Admitting: Emergency Medicine

## 2021-12-17 DIAGNOSIS — J069 Acute upper respiratory infection, unspecified: Secondary | ICD-10-CM | POA: Insufficient documentation

## 2021-12-17 DIAGNOSIS — J302 Other seasonal allergic rhinitis: Secondary | ICD-10-CM | POA: Insufficient documentation

## 2021-12-17 DIAGNOSIS — Z91040 Latex allergy status: Secondary | ICD-10-CM | POA: Insufficient documentation

## 2021-12-17 DIAGNOSIS — H9202 Otalgia, left ear: Secondary | ICD-10-CM | POA: Insufficient documentation

## 2021-12-17 NOTE — ED Provider Notes (Signed)
?[facility] EMERGENCY DEPT ?Provider Note ? ? ?CSN: [id number] ?Arrival date & time: 12/17/21  0757 ? ?  ? ?History ? ?Chief Complaint  ?Patient presents with  ? Cough  ? ? ?[name] is a 51 y.o. female. ? ?Patient has been struggling for 2 weeks with upper respiratory type symptoms.  Did have right pinkeye which has resolved.  Has a cough and congestion left ear pain no real body aches no headache no fevers.  COVID testing at home has been negative x2.  Patient also does suffer from seasonal allergies at this time.  Patient's been trying multiple over-the-counter cold and flu medicines.  Past medical history significant for allergies depression and anxiety. ? ? ?  ? ?Home Medications ?Prior to Admission medications   ?Medication Sig Start Date End Date Taking? Authorizing Provider  ?acetaminophen (TYLENOL) 500 MG tablet Take 500 mg by mouth daily as needed for moderate pain.    [provider]  ?oxyCODONE-acetaminophen (PERCOCET) 5-325 MG tablet Take 1-2 tablets by mouth every 4 (four) hours as needed. ?Patient not taking: Reported on 11/23/2017 02/11/17   Gilda Crease, MD  ?tamsulosin (FLOMAX) 0.4 MG CAPS capsule Take 1 capsule (0.4 mg total) by mouth daily. ?Patient not taking: Reported on 11/23/2017 02/11/17   Gilda Crease, MD  ?   ? ?Allergies    ?Latex   ? ?Review of Systems   ?Review of Systems  ?Constitutional:  Negative for chills and fever.  ?HENT:  Positive for congestion and ear pain. Negative for sore throat.   ?Eyes:  Negative for pain and visual disturbance.  ?Respiratory:  Positive for cough. Negative for shortness of breath and wheezing.   ?Cardiovascular:  Negative for chest pain and palpitations.  ?Gastrointestinal:  Negative for abdominal pain, diarrhea, nausea and vomiting.  ?Genitourinary:  Negative for dysuria and hematuria.  ?Musculoskeletal:  Negative for arthralgias and back pain.  ?Skin:  Negative for color change and rash.  ?Neurological:   Negative for seizures and syncope.  ?Psychiatric/Behavioral:  The patient is nervous/anxious.   ?All other systems reviewed and are negative. ? ?Physical Exam ?Updated Vital Signs ?BP (!) 167/83 (BP Location: Left Wrist)   Pulse 98   Temp 98.7 ?F (37.1 ?C) (Oral)   Resp 20   Ht 1.727 m (5\' 8" )   LMP 11/17/2021 (Approximate)   SpO2 97%   BMI 53.22 kg/m?  ?Physical Exam ?Vitals and nursing note reviewed.  ?Constitutional:   ?   General: She is not in acute distress. ?   Appearance: Normal appearance. She is well-developed.  ?HENT:  ?   Head: Normocephalic and atraumatic.  ?   Right Ear: Tympanic membrane, ear canal and external ear normal.  ?   Left Ear: Tympanic membrane, ear canal and external ear normal.  ?   Ears:  ?   Comments: Bilateral ears ear canals are normal.  Tympanic membranes are normal slight injection bilaterally no bulging. ?Eyes:  ?   Extraocular Movements: Extraocular movements intact.  ?   Conjunctiva/sclera: Conjunctivae normal.  ?   Pupils: Pupils are equal, round, and reactive to light.  ?   Comments: Sclera normal bilaterally  ?Cardiovascular:  ?   Rate and Rhythm: Normal rate and regular rhythm.  ?   Heart sounds: No murmur heard. ?Pulmonary:  ?   Effort: Pulmonary effort is normal. No respiratory distress.  ?   Breath sounds: Normal breath sounds. No stridor. No wheezing, rhonchi or rales.  ?Abdominal:  ?  Palpations: Abdomen is soft.  ?   Tenderness: There is no abdominal tenderness.  ?Musculoskeletal:     ?   General: No swelling.  ?   Cervical back: Normal range of motion and neck supple. No rigidity.  ?Lymphadenopathy:  ?   Cervical: No cervical adenopathy.  ?Skin: ?   General: Skin is warm and dry.  ?   Capillary Refill: Capillary refill takes less than 2 seconds.  ?Neurological:  ?   General: No focal deficit present.  ?   Mental Status: She is alert and oriented to person, place, and time.  ?Psychiatric:     ?   Mood and Affect: Mood normal.  ? ? ?ED Results / Procedures /  Treatments   ?Labs ?(all labs ordered are listed, but only abnormal results are displayed) ?Labs Reviewed - No data to display ? ?EKG ?None ? ?Radiology ?No results found. ? ?Procedures ?Procedures  ? ? ?Medications Ordered in ED ?Medications - No data to display ? ?ED Course/ Medical Decision Making/ A&P ?  ?                        ?Medical Decision Making ? ?Patient without any wheezing ears are normal bilaterally so no evidence of ear infection.  Symptom complex sounds like patient developed a viral upper respiratory infection that is persisting and is aggravated by seasonal allergies.  I think the fact that she had right-sided pinkeye which is now resolved implies that definitely a viral process. ? ?Patient nontoxic no acute distress.  No wheezing no evidence of ear infection will recommend treating symptomatically.  Would recommend Mucinex DM since the cough is the main problem now.  And also taking an over-the-counter allergy medicine like Zyrtec or Claritin on a regular basis since I think the upper respiratory infection is aggravated by her seasonal allergies. ? ? ?Final Clinical Impression(s) / ED Diagnoses ?Final diagnoses:  ?None  ? ? ?Rx / DC Orders ?ED Discharge Orders   ? ? None  ? ?  ? ? ?  ?Vanetta Mulders, MD ?12/17/21 [phone] ? ?

## 2021-12-17 NOTE — ED Triage Notes (Signed)
Pt arrived ambulatory via POV. Pt c/o nonproductive cough, congestion, unable to hear from L ear for approx 2 weeks. Pt also reports she had a couple days of redness, swelling, and discharge from her eye and sore throat but that those have subsided. Pt states she has done two home tests for COVID which were negative. Pt denying pain. Pt states she thought it was just allergies initially and has taken allergy meds along with mucinex and sudafed but cough and congestion are still ongoing. ?

## 2021-12-17 NOTE — Discharge Instructions (Signed)
Based on your symptoms I would recommend Mucinex DM for the cough and phlegm.  Also would recommend taking Claritin or Zyrtec allergy antihistamine medicine on a regular basis.  No signs of ear infection here today.  Return for any new or worse symptoms ?
